# Patient Record
Sex: Male | Born: 1975 | Hispanic: Yes | Marital: Married | Smoking: Former smoker
Health system: Southern US, Community
[De-identification: ages and names within clinical notes are randomized; demographics above are authoritative.]

## PROBLEM LIST (undated history)

## (undated) DIAGNOSIS — Z87442 Personal history of urinary calculi: Secondary | ICD-10-CM

---

## 2014-12-19 ENCOUNTER — Emergency Department (HOSPITAL_COMMUNITY): Payer: Self-pay

## 2014-12-19 ENCOUNTER — Emergency Department (HOSPITAL_COMMUNITY)
Admission: EM | Admit: 2014-12-19 | Discharge: 2014-12-20 | Disposition: A | Discharge status: Home / Self Care | Payer: Self-pay | Attending: Emergency Medicine | Admitting: Emergency Medicine

## 2014-12-19 ENCOUNTER — Encounter (HOSPITAL_COMMUNITY): Payer: Self-pay | Admitting: Emergency Medicine

## 2014-12-19 DIAGNOSIS — Z720 Tobacco use: Secondary | ICD-10-CM | POA: Insufficient documentation

## 2014-12-19 DIAGNOSIS — R0602 Shortness of breath: Secondary | ICD-10-CM | POA: Insufficient documentation

## 2014-12-19 LAB — CBC
HCT: 44.1 % (ref 39.0–52.0)
HEMOGLOBIN: 15.1 g/dL (ref 13.0–17.0)
MCH: 29.8 pg (ref 26.0–34.0)
MCHC: 34.2 g/dL (ref 30.0–36.0)
MCV: 87.2 fL (ref 78.0–100.0)
Platelets: 237 10*3/uL (ref 150–400)
RBC: 5.06 MIL/uL (ref 4.22–5.81)
RDW: 12.3 % (ref 11.5–15.5)
WBC: 6.8 10*3/uL (ref 4.0–10.5)

## 2014-12-19 LAB — BASIC METABOLIC PANEL
ANION GAP: 9 (ref 5–15)
BUN: 14 mg/dL (ref 6–20)
CO2: 20 mmol/L — ABNORMAL LOW (ref 22–32)
Calcium: 8.6 mg/dL — ABNORMAL LOW (ref 8.9–10.3)
Chloride: 107 mmol/L (ref 101–111)
Creatinine, Ser: 0.93 mg/dL (ref 0.61–1.24)
GFR calc Af Amer: >60 mL/min (ref >60)
Glucose, Bld: 121 mg/dL — ABNORMAL HIGH (ref 65–99)
Potassium: 4 mmol/L (ref 3.5–5.1)
SODIUM: 136 mmol/L (ref 135–145)

## 2014-12-19 LAB — I-STAT TROPONIN, ED
TROPONIN I, POC: 0 ng/mL (ref 0.00–0.08)
Troponin i, poc: 0 ng/mL (ref 0.00–0.08)

## 2014-12-19 LAB — BRAIN NATRIURETIC PEPTIDE: B Natriuretic Peptide: 6 pg/mL (ref 0.0–100.0)

## 2014-12-19 NOTE — ED Provider Notes (Signed)
This chart was scribed for Layla Maw Ward, DO by Abel Presto, ED Scribe. This patient was seen in room D32C/D32C.  TIME SEEN: 11:55 PM   CHIEF COMPLAINT: Shortness of Breath  HPI: HPI Comments: Evan Nunez is a 39 y.o. male with no significant PMHX who presents to the Emergency Department complaining of worsening constant SOB with onset around 4 PM. Pt states he was sitting at onset. Pt notes associated sharp chest pain, numbness to general neck, and mild weakness in bilateral legs with ambulation. Pt reports recent stressors and notes he has been working over 50 hours a week. Pt is a smoker. Pt denies h/o PE/DVT, recent prolonged travel or surgeries, hospitalization, fracture, lower extremity swelling or pain and cardiac FHX. Pt denies cough and fever. Pain is not exertional or pleuritic. He is not having the chest pain currently.  ROS: See HPI Constitutional: no fever  Eyes: no drainage  ENT: no runny nose   Cardiovascular:  chest pain  Resp: SOB  GI: no vomiting GU: no dysuria Integumentary: no rash  Allergy: no hives  Musculoskeletal: no leg swelling  Neurological: no slurred speech ROS otherwise negative  PAST MEDICAL HISTORY/PAST SURGICAL HISTORY:  History reviewed. No pertinent past medical history.  MEDICATIONS:  Prior to Admission medications   Not on File    ALLERGIES:  Allergies not on file  SOCIAL HISTORY:  History  Substance Use Topics  . Smoking status: Current Every Day Smoker    Types: Cigarettes  . Smokeless tobacco: Not on file  . Alcohol Use: Yes    FAMILY HISTORY: No family history on file.  EXAM: BP 144/79 mmHg  Pulse 79  Temp(Src) 98 F (36.7 C) (Oral)  Resp 18  Wt 169 lb (76.658 kg)  SpO2 98% CONSTITUTIONAL: Alert and oriented and responds appropriately to questions. Well-appearing; well-nourished HEAD: Normocephalic EYES: Conjunctivae clear, PERRL ENT: normal nose; no rhinorrhea; moist mucous membranes; pharynx without lesions  noted NECK: Supple, no meningismus, no LAD  CARD: RRR; S1 and S2 appreciated; no murmurs, no clicks, no rubs, no gallops RESP: Normal chest excursion without splinting or tachypnea; breath sounds clear and equal bilaterally; no wheezes, no rhonchi, no rales, no hypoxia or respiratory distress, speaking full sentences ABD/GI: Normal bowel sounds; non-distended; soft, non-tender, no rebound, no guarding BACK:  The back appears normal and is non-tender to palpation, there is no CVA tenderness EXT: Normal ROM in all joints; non-tender to palpation; no edema; normal capillary refill; no cyanosis    SKIN: Normal color for age and race; warm NEURO: Moves all extremities equally PSYCH: The patient's mood and manner are appropriate. Grooming and personal hygiene are appropriate.  MEDICAL DECISION MAKING: Patient here with atypical chest pain. Chest wall nontender. He has no risk factors for pulmonary embolus or risk factors for ACS other than tobacco use. PERC negative. Lungs are clear with good aeration and no hypoxia. Chest x-ray clear. Troponin negative. EKG shows no ischemic changes. He states that he thinks some of his symptoms may be secondary to stress and has been feeling anxious and feels like he needs to "catch his breath". I do not feel he needs any further emergent workup today. Have given him outpatient follow-up information. Discussed return precautions. He verbalizes understanding and is comfortable with plan.     EKG Interpretation  Date/Time:  Sunday Dec 19 2014 22:55:18 EDT Ventricular Rate:  77 PR Interval:  156 QRS Duration: 94 QT Interval:  368 QTC Calculation: 416 R Axis:  115 Text Interpretation:  Normal sinus rhythm Left posterior fascicular block Abnormal ECG No old tracing to compare Confirmed by WARD,  DO, KRISTEN ([id number]) on 5/22/[phone] 11:49:34 PM        I personally performed the services described in this documentation, which was scribed in my presence. The recorded  information has been reviewed and is accurate.   Layla MawKristen N Ward, DO 12/20/14 [id number]

## 2014-12-19 NOTE — ED Notes (Signed)
C/o difficulty breathing, feeling shaky all over, and numbness to both sides of neck since this afternoon.  Denies pain.

## 2014-12-20 NOTE — Discharge Instructions (Signed)
Your labs, chest x-ray, vital signs and exam were reassuring today. I recommend he follow-up with a primary care physician if your symptoms continue.   Shortness of Breath Shortness of breath means you have trouble breathing. It could also mean that you have a medical problem. You should get immediate medical care for shortness of breath. CAUSES   Not enough oxygen in the air such as with high altitudes or a smoke-filled room.  Certain lung diseases, infections, or problems.  Heart disease or conditions, such as angina or heart failure.  Low red blood cells (anemia).  Poor physical fitness, which can cause shortness of breath when you exercise.  Chest or back injuries or stiffness.  Being overweight.  Smoking.  Anxiety, which can make you feel like you are not getting enough air. DIAGNOSIS  Serious medical problems can often be found during your physical exam. Tests may also be done to determine why you are having shortness of breath. Tests may include:  Chest X-rays.  Lung function tests.  Blood tests.  An electrocardiogram (ECG).  An ambulatory electrocardiogram. An ambulatory ECG records your heartbeat patterns over a 24-hour period.  Exercise testing.  A transthoracic echocardiogram (TTE). During echocardiography, sound waves are used to evaluate how blood flows through your heart.  A transesophageal echocardiogram (TEE).  Imaging scans. Your health care provider may not be able to find a cause for your shortness of breath after your exam. In this case, it is important to have a follow-up exam with your health care provider as directed.  TREATMENT  Treatment for shortness of breath depends on the cause of your symptoms and can vary greatly. HOME CARE INSTRUCTIONS   Do not smoke. Smoking is a common cause of shortness of breath. If you smoke, ask for help to quit.  Avoid being around chemicals or things that may bother your breathing, such as paint fumes and  dust.  Rest as needed. Slowly resume your usual activities.  If medicines were prescribed, take them as directed for the full length of time directed. This includes oxygen and any inhaled medicines.  Keep all follow-up appointments as directed by your health care provider. SEEK MEDICAL CARE IF:   Your condition does not improve in the time expected.  You have a hard time doing your normal activities even with rest.  You have any new symptoms. SEEK IMMEDIATE MEDICAL CARE IF:   Your shortness of breath gets worse.  You feel light-headed, faint, or develop a cough not controlled with medicines.  You start coughing up blood.  You have pain with breathing.  You have chest pain or pain in your arms, shoulders, or abdomen.  You have a fever.  You are unable to walk up stairs or exercise the way you normally do. MAKE SURE YOU:  Understand these instructions.  Will watch your condition.  Will get help right away if you are not doing well or get worse. Document Released: 04/10/2001 Document Revised: 07/21/2013 Document Reviewed: 10/01/2011 [facility] Patient Information 2015 [place], [place]. This information is not intended to replace advice given to you by your health care provider. Make sure you discuss any questions you have with your health care provider.

## 2015-12-20 ENCOUNTER — Encounter (HOSPITAL_COMMUNITY): Payer: Self-pay

## 2015-12-20 ENCOUNTER — Emergency Department (HOSPITAL_COMMUNITY)
Admission: EM | Admit: 2015-12-20 | Discharge: 2015-12-20 | Disposition: A | Discharge status: Home / Self Care | Payer: Self-pay | Attending: Emergency Medicine | Admitting: Emergency Medicine

## 2015-12-20 DIAGNOSIS — F17210 Nicotine dependence, cigarettes, uncomplicated: Secondary | ICD-10-CM | POA: Insufficient documentation

## 2015-12-20 DIAGNOSIS — L509 Urticaria, unspecified: Secondary | ICD-10-CM

## 2015-12-20 DIAGNOSIS — T7840XA Allergy, unspecified, initial encounter: Secondary | ICD-10-CM

## 2015-12-20 MED ORDER — HYDROXYZINE HCL 25 MG PO TABS: 25.0000 mg | ORAL_TABLET | Freq: Four times a day (QID) | ORAL | Status: DC | PRN

## 2015-12-20 MED ORDER — PREDNISONE 10 MG (21) PO TBPK: 10.0000 mg | ORAL_TABLET | Freq: Every day | ORAL | Status: DC

## 2015-12-20 MED ORDER — FAMOTIDINE 20 MG PO TABS: 20.0000 mg | ORAL_TABLET | Freq: Two times a day (BID) | ORAL | Status: DC

## 2015-12-20 MED ORDER — DIPHENHYDRAMINE HCL 25 MG PO TABS
ORAL_TABLET | ORAL | Status: DC
Start: 2015-12-20 — End: 2018-02-21

## 2015-12-20 MED ORDER — EPINEPHRINE 0.3 MG/0.3ML IJ SOAJ: 0.3000 mg | Freq: Once | INTRAMUSCULAR | Status: DC | PRN

## 2015-12-20 MED ORDER — DIPHENHYDRAMINE HCL 25 MG PO CAPS
25.0000 mg | ORAL_CAPSULE | Freq: Once | ORAL | Status: AC
  Administered 2015-12-20: 25 mg via ORAL
  Filled 2015-12-20: qty 1

## 2015-12-20 MED ORDER — FAMOTIDINE 20 MG PO TABS
20.0000 mg | ORAL_TABLET | Freq: Once | ORAL | Status: AC
  Administered 2015-12-20: 20 mg via ORAL
  Filled 2015-12-20: qty 1

## 2015-12-20 MED ORDER — PREDNISONE 20 MG PO TABS
60.0000 mg | ORAL_TABLET | Freq: Once | ORAL | Status: AC
  Administered 2015-12-20: 60 mg via ORAL
  Filled 2015-12-20: qty 3

## 2015-12-20 NOTE — ED Provider Notes (Signed)
CSN: [id number]     Arrival date & time 12/20/15  4098 History   First MD Initiated Contact with Patient 12/20/15 [phone]     Chief Complaint  Patient presents with  . Rash     (Consider location/radiation/quality/duration/timing/severity/associated sxs/prior Treatment) HPI   Evan Nunez is a 40 year old male, current smoker, otherwise healthy, who presents emergency department for evaluation of pruritic rash and hives located in his legs, groin area and side of abdomen. He states that he has had intermittent hives with worsening severity over the last 2 months. He occasionally takes Benadryl but has been following instructions on the box which says not take more than 3 in 1 day. Last night his hives were the severe that he was unable to sleep and he attempted to treat at home with only topical Benadryl without much relief.  He states that he has multiple episodes over the past 2 months of hives with associated facial swelling, lip swelling, numbness and tingling of his face and shortness of breath with wheeze.  He currently does not have any facial or intraoral edema or shortness of breath or wheeze. He denies any past history of seasonal, environmental or food allergies. No past medical history of asthma, eczema or atopy.  He has had the same detergent for several years, he has not tried any new foods. He does not take any prescription or over-the-counter medicine except for recently taking Benadryl.   He currently denies chest pain, shortness of breath, cough, wheeze, bowel pain, nausea, vomiting.   History reviewed. No pertinent past medical history. History reviewed. No pertinent past surgical history. No family history on file. Social History  Substance Use Topics  . Smoking status: Current Every Day Smoker    Types: Cigarettes  . Smokeless tobacco: None  . Alcohol Use: Yes    Review of Systems  All other systems reviewed and are negative.     Allergies  Review of patient's  allergies indicates no known allergies.  Home Medications   Prior to Admission medications   Medication Sig Start Date End Date Taking? Authorizing Provider  diphenhydrAMINE (BENADRYL) 25 MG tablet Take 25 to 50 mg every 4 to 8 hours as needed for allergic reaction or hives, do not exceed 300 mg/day 12/20/15   Danelle Berry, PA-C  EPINEPHrine (EPIPEN 2-PAK) 0.3 mg/0.3 mL IJ SOAJ injection Inject 0.3 mLs (0.3 mg total) into the muscle once as needed (for severe allergic reaction). CAll 911 immediately if you have to use this medicine 12/20/15   Danelle Berry, PA-C  famotidine (PEPCID) 20 MG tablet Take 1 tablet (20 mg total) by mouth 2 (two) times daily. 12/20/15   Danelle Berry, PA-C  hydrOXYzine (ATARAX/VISTARIL) 25 MG tablet Take 1 tablet (25 mg total) by mouth every 6 (six) hours as needed for itching. 12/20/15   Danelle Berry, PA-C  predniSONE (STERAPRED UNI-PAK 21 TAB) 10 MG (21) TBPK tablet Take 1 tablet (10 mg total) by mouth daily. Take 6 tabs by mouth daily  for 2 days, then 5 tabs for 2 days, then 4 tabs for 2 days, then 3 tabs for 2 days, 2 tabs for 2 days, then 1 tab by mouth daily for 2 days 12/20/15   Danelle Berry, PA-C   BP 142/83 mmHg  Pulse 80  Temp(Src) 98.3 F (36.8 C) (Oral)  Resp 18  Ht 5\' 2"  (1.575 m)  Wt 72.576 kg  BMI 29.26 kg/m2  SpO2 100% Physical Exam  Constitutional: He is oriented to person,  place, and time. He appears well-developed and well-nourished. No distress.  HENT:  Head: Normocephalic and atraumatic. Head is without right periorbital erythema and without left periorbital erythema.  Right Ear: External ear normal.  Left Ear: External ear normal.  Nose: Nose normal.  Mouth/Throat: Uvula is midline, oropharynx is clear and moist and mucous membranes are normal. Mucous membranes are not pale, not dry and not cyanotic. No trismus in the jaw. No uvula swelling. No oropharyngeal exudate, posterior oropharyngeal edema or posterior oropharyngeal erythema.  Eyes:  Conjunctivae and EOM are normal. Pupils are equal, round, and reactive to light. Right eye exhibits no discharge. Left eye exhibits no discharge. No scleral icterus.  Neck: Normal range of motion. No JVD present. No tracheal deviation present. No thyromegaly present.  Cardiovascular: Normal rate, regular rhythm, normal heart sounds and intact distal pulses.  Exam reveals no gallop and no friction rub.   No murmur heard. Pulmonary/Chest: Effort normal and breath sounds normal. No respiratory distress. He has no wheezes. He has no rales. He exhibits no tenderness.  Abdominal: Soft. Bowel sounds are normal. He exhibits no distension and no mass. There is no tenderness. There is no rebound and no guarding.  Musculoskeletal: Normal range of motion. He exhibits no edema or tenderness.  Lymphadenopathy:    He has no cervical adenopathy.  Neurological: He is alert and oriented to person, place, and time. He has normal reflexes. No cranial nerve deficit. He exhibits normal muscle tone. Coordination normal.  Skin: Skin is warm and dry. No rash noted. He is not diaphoretic. No erythema. No pallor.  Hives to bilateral inner thighs  Psychiatric: He has a normal mood and affect. His behavior is normal. Judgment and thought content normal.  Nursing note and vitals reviewed.   ED Course  Procedures (including critical care time) Labs Review Labs Reviewed - No data to display  Imaging Review No results found. I have personally reviewed and evaluated these images and lab results as part of my medical decision-making.   EKG Interpretation None      MDM   Rash consistent with hives. Patient denies any current difficulty breathing or swallowing.  Pt has a patent airway without stridor and is handling secretions without difficulty; no angioedema. No blisters, no pustules, no warmth, no draining sinus tracts, no superficial abscesses, no bullous impetigo, no vesicles, no desquamation, no target lesions with  dusky purpura or a central bulla. Not tender to touch. No concern for superimposed infection. No concern for SJS, TEN, TSS, tick borne illness, syphilis or other life-threatening condition. Will discharge home with steroid taper, pepcid, benadryl and also atarax for future use with hives.  Pt also given epi pen, reviewed instructions thoroughly.  Pt reported hives intermitently for 2 months and several episodes of facial and lip edema, unknown cause, pt understands to keep epi pen with him at all time and to use if experiencing worsening facial sx or SOB.  He currently has no such sx, and his hives have greatly improved since his presentation.  Case management to assist with follow up appointment.  Patient re-evaluated prior to dc, is hemodynamically stable, in no respiratory distress, and denies the feeling of throat closing. Pt has been advised to take OTC benadryl & return to the ED if they have a mod-severe allergic rxn (s/s including throat closing, difficulty breathing, swelling of lips face or tongue). Pt is to follow up with their PCP. Pt is agreeable with plan & verbalizes understanding.   Final  diagnoses:  Hives of unknown origin  Allergic reaction, initial encounter        Danelle BerryLeisa Allura Doepke, PA-C 12/21/15 1215  Arby BarretteMarcy Pfeiffer, MD 12/22/15 [id number]

## 2015-12-20 NOTE — Discharge Instructions (Signed)
Hives Hives are itchy, red, swollen areas of the skin. They can vary in size and location on your body. Hives can come and go for hours or several days (acute hives) or for several weeks (chronic hives). Hives do not spread from person to person (noncontagious). They may get worse with scratching, exercise, and emotional stress. CAUSES   Allergic reaction to food, additives, or drugs.  Infections, including the common cold.  Illness, such as vasculitis, lupus, or thyroid disease.  Exposure to sunlight, heat, or cold.  Exercise.  Stress.  Contact with chemicals. SYMPTOMS   Red or white swollen patches on the skin. The patches may change size, shape, and location quickly and repeatedly.  Itching.  Swelling of the hands, feet, and face. This may occur if hives develop deeper in the skin. DIAGNOSIS  Your caregiver can usually tell what is wrong by performing a physical exam. Skin or blood tests may also be done to determine the cause of your hives. In some cases, the cause cannot be determined. TREATMENT  Mild cases usually get better with medicines such as antihistamines. Severe cases may require an emergency epinephrine injection. If the cause of your hives is known, treatment includes avoiding that trigger.  HOME CARE INSTRUCTIONS   Avoid causes that trigger your hives.  Take antihistamines as directed by your caregiver to reduce the severity of your hives. Non-sedating or low-sedating antihistamines are usually recommended. Do not drive while taking an antihistamine.  Take any other medicines prescribed for itching as directed by your caregiver.  Wear loose-fitting clothing.  Keep all follow-up appointments as directed by your caregiver. SEEK MEDICAL CARE IF:   You have persistent or severe itching that is not relieved with medicine.  You have painful or swollen joints. SEEK IMMEDIATE MEDICAL CARE IF:   You have a fever.  Your tongue or lips are swollen.  You have  trouble breathing or swallowing.  You feel tightness in the throat or chest.  You have abdominal pain. These problems may be the first sign of a life-threatening allergic reaction. Call your local emergency services (911 in [place] MAKE SURE YOU:   Understand these instructions.  Will watch your condition.  Will get help right away if you are not doing well or get worse.   This information is not intended to replace advice given to you by your health care provider. Make sure you discuss any questions you have with your health care provider.   Document Released: 07/16/2005 Document Revised: 07/21/2013 Document Reviewed: 10/09/2011 Elsevier Interactive Patient Education 2016 Elsevier Inc[employer]  Epinephrine Injection Epinephrine is a medicine given by injection to temporarily treat an emergency allergic reaction. It is also used to treat severe asthmatic attacks and other lung problems. The medicine helps to enlarge (dilate) the small breathing tubes of the lungs. A life-threatening, sudden allergic reaction that involves the whole body is called anaphylaxis. Because of potential side effects, epinephrine should only be used as directed by your caregiver. RISKS AND COMPLICATIONS Possible side effects of epinephrine injections include:  Chest pain.  Irregular or rapid heartbeat.  Shortness of breath.  Nausea.  Vomiting.  Abdominal pain or cramping.  Sweating.  Dizziness.  Weakness.  Headache.  Nervousness. Report all side effects to your caregiver. HOW TO GIVE AN EPINEPHRINE INJECTION Give the epinephrine injection immediately when symptoms of a severe reaction begin. Inject the medicine into the outer thigh or any available, large muscle. Your caregiver can teach you how to do this. You do  not need to remove any clothing. After the injection, call your local emergency services (911 in [place] Even if you improve after the injection, you need to be examined at a hospital  emergency department. Epinephrine works quickly, but it also wears off quickly. Delayed reactions can occur. A delayed reaction may be as serious and dangerous as the initial reaction. HOME CARE INSTRUCTIONS  Make sure you and your family know how to give an epinephrine injection.  Use epinephrine injections as directed by your caregiver. Do not use this medicine more often or in larger doses than prescribed.  Always carry your epinephrine injection or anaphylaxis kit with you. This can be lifesaving if you have a severe reaction.  Store the medicine in a cool, dry place. If the medicine becomes discolored or cloudy, dispose of it properly and replace it with new medicine.  Check the expiration date on your medicine. It may be unsafe to use medicines past their expiration date.  Tell your caregiver about any other medicines you are taking. Some medicines can react badly with epinephrine.  Tell your caregiver about any medical conditions you have, such as diabetes, high blood pressure (hypertension), heart disease, irregular heartbeats, or if you are pregnant. SEEK IMMEDIATE MEDICAL CARE IF:  You have used an epinephrine injection. Call your local emergency services (911 in [place] Even if you improve after the injection, you need to be examined at a hospital emergency department to make sure your allergic reaction is under control. You will also be monitored for adverse effects from the medicine.  You have chest pain.  You have irregular or fast heartbeats.  You have shortness of breath.  You have severe headaches.  You have severe nausea, vomiting, or abdominal cramps.  You have severe pain, swelling, or redness in the area where you gave the injection.   This information is not intended to replace advice given to you by your health care provider. Make sure you discuss any questions you have with your health care provider.   Document Released: 07/13/2000 Document Revised: 10/08/2011  Document Reviewed: 02/02/2015 Elsevier Interactive Patient Education 2016 Elsevier Inc.  Angioedema Angioedema is a sudden swelling of tissues, often of the skin. It can occur on the face or genitals or in the abdomen or other body parts. The swelling usually develops over a short period and gets better in 24 to 48 hours. It often begins during the night and is found when the person wakes up. The person may also get red, itchy patches of skin (hives). Angioedema can be dangerous if it involves swelling of the air passages.  Depending on the cause, episodes of angioedema may only happen once, come back in unpredictable patterns, or repeat for several years and then gradually fade away.  CAUSES  Angioedema can be caused by an allergic reaction to various triggers. It can also result from nonallergic causes, including reactions to drugs, immune system disorders, viral infections, or an abnormal gene that is passed to you from your parents (hereditary). For some people with angioedema, the cause is unknown.  Some things that can trigger angioedema include:   Foods.   Medicines, such as ACE inhibitors, ARBs, nonsteroidal anti-inflammatory agents, or estrogen.   Latex.   Animal saliva.   Insect stings.   Dyes used in X-rays.   Mild injury.   Dental work.  Surgery.  Stress.   Sudden changes in temperature.   Exercise. SIGNS AND SYMPTOMS   Swelling of the skin.  Hives. If  these are present, there is also intense itching.  Redness in the affected area.   Pain in the affected area.  Swollen lips or tongue.  Breathing problems. This may happen if the air passages swell.  Wheezing. If internal organs are involved, there may be:   Nausea.   Abdominal pain.   Vomiting.   Difficulty swallowing.   Difficulty passing urine. DIAGNOSIS   Your health care provider will examine the affected area and take a medical and family history.  Various tests may be done  to help determine the cause. Tests may include:  Allergy skin tests to see if the problem is an allergic reaction.   Blood tests to check for hereditary angioedema.   Tests to check for underlying diseases that could cause the condition.   A review of your medicines, including over-the-counter medicines, may be done. TREATMENT  Treatment will depend on the cause of the angioedema. Possible treatments include:   Removal of anything that triggered the condition (such as stopping certain medicines).   Medicines to treat symptoms or prevent attacks. Medicines given may include:   Antihistamines.   Epinephrine injection.   Steroids.   Hospitalization may be required for severe attacks. If the air passages are affected, it can be an emergency. Tubes may need to be placed to keep the airway open. HOME CARE INSTRUCTIONS   Take all medicines as directed by your health care provider.  If you were given medicines for emergency allergy treatment, always carry them with you.  Wear a medical bracelet as directed by your health care provider.   Avoid known triggers. SEEK MEDICAL CARE IF:   You have repeat attacks of angioedema.   Your attacks are more frequent or more severe despite preventive measures.   You have hereditary angioedema and are considering having children. It is important to discuss with your health care provider the risks of passing the condition on to your children. SEEK IMMEDIATE MEDICAL CARE IF:   You have severe swelling of the mouth, tongue, or lips.  You have difficulty breathing.   You have difficulty swallowing.   You faint. MAKE SURE YOU:  Understand these instructions.  Will watch your condition.  Will get help right away if you are not doing well or get worse.   This information is not intended to replace advice given to you by your health care provider. Make sure you discuss any questions you have with your health care provider.     Document Released: 09/24/2001 Document Revised: 08/06/2014 Document Reviewed: 03/09/2013 Elsevier Interactive Patient Education 2016 [place]. Anaphylactic Reaction An anaphylactic reaction is a sudden, severe allergic reaction that involves the whole body. It can be life threatening. A hospital stay is often required. People with asthma, eczema, or hay fever are slightly more likely to have an anaphylactic reaction. CAUSES  An anaphylactic reaction may be caused by anything to which you are allergic. After being exposed to the allergic substance, your immune system becomes sensitized to it. When you are exposed to that allergic substance again, an allergic reaction can occur. Common causes of an anaphylactic reaction include:  Medicines.  Foods, especially peanuts, wheat, shellfish, milk, and eggs.  Insect bites or stings.  Blood products.  Chemicals, such as dyes, latex, and contrast material used for imaging tests. SYMPTOMS  When an allergic reaction occurs, the body releases histamine and other substances. These substances cause symptoms such as tightening of the airway. Symptoms often develop within seconds or minutes of  exposure. Symptoms may include:  Skin rash or hives.  Itching.  Chest tightness.  Swelling of the eyes, tongue, or lips.  Trouble breathing or swallowing.  Lightheadedness or fainting.  Anxiety or confusion.  Stomach pains, vomiting, or diarrhea.  Nasal congestion.  A fast or irregular heartbeat (palpitations). DIAGNOSIS  Diagnosis is based on your history of recent exposure to allergic substances, your symptoms, and a physical exam. Your caregiver may also perform blood or urine tests to confirm the diagnosis. TREATMENT  Epinephrine medicine is the main treatment for an anaphylactic reaction. Other medicines that may be used for treatment include antihistamines, steroids, and albuterol. In severe cases, fluids and medicine to support blood  pressure may be given through an intravenous line (IV). Even if you improve after treatment, you need to be observed to make sure your condition does not get worse. This may require a stay in the hospital. [place] a medical alert bracelet or necklace stating your allergy.  You and your family must learn how to use an anaphylaxis kit or give an epinephrine injection to temporarily treat an emergency allergic reaction. Always carry your epinephrine injection or anaphylaxis kit with you. This can be lifesaving if you have a severe reaction.  Do not drive or perform tasks after treatment until the medicines used to treat your reaction have worn off, or until your caregiver says it is okay.  If you have hives or a rash:  Take medicines as directed by your caregiver.  You may use an over-the-counter antihistamine (diphenhydramine) as needed.  Apply cold compresses to the skin or take baths in cool water. Avoid hot baths or showers. SEEK MEDICAL CARE IF:   You develop symptoms of an allergic reaction to a new substance. Symptoms may start right away or minutes later.  You develop a rash, hives, or itching.  You develop new symptoms. SEEK IMMEDIATE MEDICAL CARE IF:   You have swelling of the mouth, difficulty breathing, or wheezing.  You have a tight feeling in your chest or throat.  You develop hives, swelling, or itching all over your body.  You develop severe vomiting or diarrhea.  You feel faint or pass out. This is an emergency. Use your epinephrine injection or anaphylaxis kit as you have been instructed. Call your local emergency services (911 in [place] Even if you improve after the injection, you need to be examined at a hospital emergency department. MAKE SURE YOU:   Understand these instructions.  Will watch your condition.  Will get help right away if you are not doing well or get worse.   This information is not intended to replace advice given to  you by your health care provider. Make sure you discuss any questions you have with your health care provider.   Document Released: 07/16/2005 Document Revised: 07/21/2013 Document Reviewed: 01/26/2015 Elsevier Interactive Patient Education 2016 [employer].  Allergies An allergy is an abnormal reaction to a substance by the body's defense system (immune system). Allergies can develop at any age. WHAT CAUSES ALLERGIES? An allergic reaction happens when the immune system mistakenly reacts to a normally harmless substance, called an allergen, as if it were harmful. The immune system releases antibodies to fight the substance. Antibodies eventually release a chemical called histamine into the bloodstream. The release of histamine is meant to protect the body from infection, but it also causes discomfort. An allergic reaction can be triggered by:  Eating an allergen.  Inhaling an allergen.  Touching an allergen. WHAT TYPES OF ALLERGIES ARE THERE? There are many types of allergies. Common types include:  Seasonal allergies. People with this type of allergy are usually allergic to substances that are only present during certain seasons, such as molds and pollens.  Food allergies.  Drug allergies.  Insect allergies.  Animal dander allergies. WHAT ARE SYMPTOMS OF ALLERGIES? Possible allergy symptoms include:  Swelling of the lips, face, tongue, mouth, or throat.  Sneezing, coughing, or wheezing.  Nasal congestion.  Tingling in the mouth.  Rash.  Itching.  Itchy, red, swollen areas of skin (hives).  Watery eyes.  Vomiting.  Diarrhea.  Dizziness.  Lightheadedness.  Fainting.  Trouble breathing or swallowing.  Chest tightness.  Rapid heartbeat. HOW ARE ALLERGIES DIAGNOSED? Allergies are diagnosed with a medical and family history and one or more of the following:  Skin tests.  Blood tests.  A food diary. A food diary is a record of all the foods and drinks  you have in a day and of all the symptoms you experience.  The results of an elimination diet. An elimination diet involves eliminating foods from your diet and then adding them back in one by one to find out if a certain food causes an allergic reaction. HOW ARE ALLERGIES TREATED? There is no cure for allergies, but allergic reactions can be treated with medicine. Severe reactions usually need to be treated at a hospital. HOW CAN REACTIONS BE PREVENTED? The best way to prevent an allergic reaction is by avoiding the substance you are allergic to. Allergy shots and medicines can also help prevent reactions in some cases. People with severe allergic reactions may be able to prevent a life-threatening reaction called anaphylaxis with a medicine given right after exposure to the allergen.   This information is not intended to replace advice given to you by your health care provider. Make sure you discuss any questions you have with your health care provider.   Document Released: 10/09/2002 Document Revised: 08/06/2014 Document Reviewed: 04/27/2014 Elsevier Interactive Patient Education [employer].

## 2015-12-20 NOTE — ED Notes (Signed)
Patient here with intermittent itchy rash to legs and sides. Mild hives with same. Takes benadryl occasionally with relief. Area red and trying benadryl cream

## 2015-12-20 NOTE — Progress Notes (Signed)
Spoke to patient regarding primary care resources and [facility]. Follow up appointment made with [facility] for Monday June 12,2017 @11 :00am, pt verbalized understanding of the upcoming appointment. Orange card application provided and explained, pt instructed to contact me once application is complete to obtain the orange card. NCM consulted regarding patient medications. My contact information provided for any future questions or concerns. No other [facility] needs identified at this time.    Buddy DutyFelicia Evans [id number] [phone]

## 2016-01-09 ENCOUNTER — Ambulatory Visit: Payer: Self-pay | Admitting: Family Medicine

## 2017-03-26 ENCOUNTER — Encounter (INDEPENDENT_AMBULATORY_CARE_PROVIDER_SITE_OTHER): Payer: Self-pay | Admitting: Orthopaedic Surgery

## 2017-03-26 ENCOUNTER — Ambulatory Visit (INDEPENDENT_AMBULATORY_CARE_PROVIDER_SITE_OTHER): Payer: Self-pay | Admitting: Orthopaedic Surgery

## 2017-03-26 VITALS — BP 141/73 | HR 76 | Resp 14 | Ht 67.0 in | Wt 165.0 lb

## 2017-03-26 DIAGNOSIS — M65342 Trigger finger, left ring finger: Secondary | ICD-10-CM

## 2017-03-26 NOTE — Progress Notes (Signed)
Office Visit Note   Patient: Evan Nunez           Date of Birth: [birth year]           MRN: [id number] Visit Date: 03/26/2017              Requested by: No referring provider defined for this encounter. PCP: Patient, No Pcp Per   Assessment & Plan: Visit Diagnoses:  1. Trigger finger, left ring finger     Plan: Long discussion regarding pathology. Has had the problem progressively over several years. I discussed surgery and he would like to proceed with A1 pulley release. We'll try and schedule as soon as possible .we'll give him a note saying that he can't lift over 5 pounds with his left upper extremity  Follow-Up Instructions: Return will schedule surgery.   Orders:  No orders of the defined types were placed in this encounter.  No orders of the defined types were placed in this encounter.     Procedures: No procedures performed   Clinical Data: No additional findings.   Subjective: Chief Complaint  Patient presents with  . Left Hand - Pain, Edema    Evan Nunez is a 41 y o that presents with Left Long finger and Left ring finger gettting stuck. Pt bought a splint at [employer], numbness and tingling in those fingers  Has had episodic pain in that same left ring finger over several years. Is reached a point was having considerable pain performing activities of daily living and his vocational activities. 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 He's purchased a small splint from [employer] but that this interferes with his activities. "Quite uncomfortable". Denies any numbness or tingling, injury or trauma.  HPI  Review of Systems  Constitutional: Negative for fatigue.  HENT: Negative for hearing loss.   Respiratory: Positive for shortness of breath. Negative for apnea and chest tightness.   Cardiovascular: Negative for chest pain, palpitations and leg swelling.  Gastrointestinal: Negative for blood in stool, constipation and diarrhea.  Genitourinary:  Negative for difficulty urinating.  Musculoskeletal: Negative for arthralgias, back pain, joint swelling, myalgias, neck pain and neck stiffness.  Neurological: Negative for weakness, numbness and headaches.  Hematological: Does not bruise/bleed easily.  Psychiatric/Behavioral: Negative for sleep disturbance. The patient is not nervous/anxious.      Objective: Vital Signs: BP (!) 141/73   Pulse 76   Resp 14   Ht 5\' 7"  (1.702 m)   Wt 165 lb (74.8 kg)   BMI 25.84 kg/m   Physical Exam  Ortho Exam left hand with tenderness directly over the palm associated with a painful nodule at the metacarpal phalangeal joint junction ring finger active triggering that he is trying to avoid it related to pain. Neurovascular exam intact. No swelling of the ring finger. No pain in the long or little finger. No pain with range of motion of the PIP joint  Specialty Comments:  No specialty comments available.  Imaging: No results found.   PMFS History: There are no active problems to display for this patient.  History reviewed. No pertinent past medical history.  History reviewed. No pertinent family history.  History reviewed. No pertinent surgical history. Social History   Occupational History  . Not on file.   Social History Main Topics  . Smoking status: Current Some Day Smoker    Types: Cigarettes  . Smokeless tobacco: Never Used  . Alcohol use Yes  . Drug use: No  .  Sexual activity: Not on file

## 2017-03-28 DIAGNOSIS — M65342 Trigger finger, left ring finger: Secondary | ICD-10-CM

## 2017-03-29 ENCOUNTER — Telehealth (INDEPENDENT_AMBULATORY_CARE_PROVIDER_SITE_OTHER): Payer: Self-pay | Admitting: Orthopaedic Surgery

## 2017-03-29 NOTE — Telephone Encounter (Signed)
Please advise 

## 2017-03-29 NOTE — Telephone Encounter (Signed)
called

## 2017-03-29 NOTE — Telephone Encounter (Signed)
FYI: Patient called to verify his follow up post op appt. Patient mentioned having a lot of pain last pm with hand, and having to take 2 pain pills to help. Per patient pain this am is better (less).

## 2017-04-02 ENCOUNTER — Ambulatory Visit (INDEPENDENT_AMBULATORY_CARE_PROVIDER_SITE_OTHER): Payer: Self-pay | Admitting: Orthopaedic Surgery

## 2017-04-02 ENCOUNTER — Inpatient Hospital Stay (INDEPENDENT_AMBULATORY_CARE_PROVIDER_SITE_OTHER): Payer: Self-pay | Admitting: Orthopaedic Surgery

## 2017-04-02 ENCOUNTER — Encounter (INDEPENDENT_AMBULATORY_CARE_PROVIDER_SITE_OTHER): Payer: Self-pay | Admitting: Orthopaedic Surgery

## 2017-04-02 VITALS — BP 120/70 | HR 70 | Ht 68.0 in | Wt 165.0 lb

## 2017-04-02 DIAGNOSIS — M79642 Pain in left hand: Secondary | ICD-10-CM

## 2017-04-02 NOTE — Progress Notes (Signed)
   Post-Op Visit Note   Patient: Evan DallasMauro Freiermuth           Date of Birth: [birth year]           MRN: [id number] Visit Date: 04/02/2017 PCP: Patient, No Pcp Per   Assessment & Plan:  Chief Complaint:  Chief Complaint  Patient presents with  . Left Hand - Routine Post Op   Visit Diagnoses:  1. Pain of left hand   5 days status post left ring trigger finger release. Denies fever or chills. Dressing removed. Incision clean and dry. Waterproof Band-Aid applied and begin range of motion exercises. Neurovascular intact. Given a note for light duty at work. Return in one week for stitch removal  Follow-Up Instructions:    Orders:  No orders of the defined types were placed in this encounter.  No orders of the defined types were placed in this encounter.   Imaging: No results found.  PMFS History: There are no active problems to display for this patient.  No past medical history on file.  No family history on file.  No past surgical history on file. Social History   Occupational History  . Not on file.   Social History Main Topics  . Smoking status: Current Some Day Smoker    Types: Cigarettes  . Smokeless tobacco: Never Used  . Alcohol use Yes  . Drug use: No  . Sexual activity: Not on file

## 2017-04-11 ENCOUNTER — Encounter (INDEPENDENT_AMBULATORY_CARE_PROVIDER_SITE_OTHER): Payer: Self-pay | Admitting: Orthopedic Surgery

## 2017-04-11 ENCOUNTER — Ambulatory Visit (INDEPENDENT_AMBULATORY_CARE_PROVIDER_SITE_OTHER): Payer: Self-pay | Admitting: Orthopedic Surgery

## 2017-04-11 DIAGNOSIS — M65342 Trigger finger, left ring finger: Secondary | ICD-10-CM

## 2017-04-11 DIAGNOSIS — M65332 Trigger finger, left middle finger: Secondary | ICD-10-CM

## 2017-04-11 NOTE — Progress Notes (Signed)
   Office Visit Note   Patient: Evan Nunez           Date of Birth: 1976-03-29           MRN: [id number] Visit Date: 04/11/2017              Requested by: No referring provider defined for this encounter. PCP: Patient, No Pcp Per   Assessment & Plan: Visit Diagnoses:  1. Trigger finger, left ring finger   2. Trigger finger, left middle finger     Plan:  #1: Sutures removed and Steri-Strips are placed. Wounds healed per primam with no signs of infection. #2: Placed an AlumaFoam splint in full extension for the left middle finger  Follow-Up Instructions: Return in about 1 week (around 04/18/2017).   Orders:  No orders of the defined types were placed in this encounter.  No orders of the defined types were placed in this encounter.     Procedures: No procedures performed   Clinical Data: No additional findings.   Subjective: No chief complaint on file.   HPI  Evan Nunez returns today for follow-up of his trigger finger release. He is now 2 weeks post injury post surgery. He complains that at times it feels as if his finger of this catching and cannot straighten it completely. He is able to flex at the end of his palm he states. Denies any neurovascular compromise.  Review of Systems   Objective: Vital Signs: There were no vitals taken for this visit.  Physical Exam  Ortho Exam  Exam today reveals wound (signs of infection. I do not feel cold locking of it though he does have difficulty getting it fully extended. I was able to do that the very easily in the office. Serum does have tenderness over the operative site.  Specialty Comments:  No specialty comments available.  Imaging: No results found.   PMFS History: There are no active problems to display for this patient.  No past medical history on file.  No family history on file.  No past surgical history on file. Social History   Occupational History  . Not on file.   Social History Main  Topics  . Smoking status: Current Some Day Smoker    Types: Cigarettes  . Smokeless tobacco: Never Used  . Alcohol use Yes  . Drug use: No  . Sexual activity: Not on file

## 2017-04-22 ENCOUNTER — Ambulatory Visit (INDEPENDENT_AMBULATORY_CARE_PROVIDER_SITE_OTHER): Payer: Self-pay | Admitting: Orthopaedic Surgery

## 2018-02-21 ENCOUNTER — Ambulatory Visit (HOSPITAL_COMMUNITY)
Admission: EM | Admit: 2018-02-21 | Discharge: 2018-02-21 | Disposition: A | Discharge status: Home / Self Care | Payer: Self-pay | Attending: Family Medicine | Admitting: Family Medicine

## 2018-02-21 ENCOUNTER — Encounter (HOSPITAL_COMMUNITY): Payer: Self-pay | Admitting: Emergency Medicine

## 2018-02-21 ENCOUNTER — Other Ambulatory Visit: Payer: Self-pay

## 2018-02-21 DIAGNOSIS — R109 Unspecified abdominal pain: Secondary | ICD-10-CM

## 2018-02-21 DIAGNOSIS — R112 Nausea with vomiting, unspecified: Secondary | ICD-10-CM

## 2018-02-21 DIAGNOSIS — R197 Diarrhea, unspecified: Secondary | ICD-10-CM

## 2018-02-21 MED ORDER — ONDANSETRON HCL 4 MG PO TABS: 4.0000 mg | ORAL_TABLET | Freq: Three times a day (TID) | ORAL | 0 refills | Status: DC | PRN

## 2018-02-21 MED ORDER — ONDANSETRON 4 MG PO TBDP
ORAL_TABLET | ORAL | Status: AC
  Filled 2018-02-21: qty 1

## 2018-02-21 MED ORDER — KETOROLAC TROMETHAMINE 30 MG/ML IJ SOLN
INTRAMUSCULAR | Status: AC
  Filled 2018-02-21: qty 1

## 2018-02-21 MED ORDER — KETOROLAC TROMETHAMINE 30 MG/ML IJ SOLN
30.0000 mg | Freq: Once | INTRAMUSCULAR | Status: AC
  Administered 2018-02-21: 30 mg via INTRAMUSCULAR

## 2018-02-21 MED ORDER — ONDANSETRON 4 MG PO TBDP
4.0000 mg | ORAL_TABLET | Freq: Once | ORAL | Status: AC
Start: 2018-02-21 — End: 2018-02-21
  Administered 2018-02-21: 4 mg via ORAL

## 2018-02-21 MED ORDER — ONDANSETRON 8 MG PO TBDP: 8.0000 mg | ORAL_TABLET | Freq: Three times a day (TID) | ORAL | 0 refills | Status: DC | PRN

## 2018-02-21 NOTE — ED Provider Notes (Signed)
[facility]    CSN: [id number] Arrival date & time: 02/21/18  0845     History   Chief Complaint Chief Complaint  Patient presents with  . Abdominal Cramping    HPI Evan Nunez is a 42 y.o. male.   Patient is a healthy 42 year old male that presents with nausea, vomiting, abdominal cramping, diarrhea that started at 3 AM.  The symptoms have been constant with severe abdominal cramping.  He has had 5 episodes of watery/jelly diarrhea and 5 episodes of vomiting.  He denies any blood in his stool or vomit.  He states that he ate a meal at home last night that consisted of broccoli, pork chop, potatoes, corn and drink 2 beers.  He has a few drinks a week but is not a heavy drinker.  Nobody else in the home has been sick.  He has had chills, body aches associated with it.  He reports that the worst part is the severe abdominal cramping.  He denies any recent traveling, insect bites, rashes.  ROS per HPI      History reviewed. No pertinent past medical history.  There are no active problems to display for this patient.   History reviewed. No pertinent surgical history.     Home Medications    Prior to Admission medications   Medication Sig Start Date End Date Taking? Authorizing Provider  ondansetron (ZOFRAN ODT) 8 MG disintegrating tablet Take 1 tablet (8 mg total) by mouth every 8 (eight) hours as needed for nausea or vomiting. 02/21/18   Dahlia ByesBast, Elizabet Schweppe A, NP  ondansetron (ZOFRAN) 4 MG tablet Take 1 tablet (4 mg total) by mouth every 8 (eight) hours as needed for nausea or vomiting. 02/21/18   Janace ArisBast, Audriella Blakeley A, NP    Family History History reviewed. No pertinent family history.  Social History Social History   Tobacco Use  . Smoking status: Current Some Day Smoker    Types: Cigarettes  . Smokeless tobacco: Never Used  Substance Use Topics  . Alcohol use: Yes  . Drug use: No     Allergies   Patient has no known allergies.   Review of Systems Review of  Systems   Physical Exam Triage Vital Signs ED Triage Vitals  Enc Vitals Group     BP 02/21/18 0856 127/72     Pulse Rate 02/21/18 0856 75     Resp 02/21/18 0856 16     Temp 02/21/18 0856 98.4 F (36.9 C)     Temp Source 02/21/18 0856 Oral     SpO2 02/21/18 0856 100 %     Weight --      Height --      Head Circumference --      Peak Flow --      Pain Score 02/21/18 0858 9     Pain Loc --      Pain Edu? --      Excl. in GC? --    No data found.  Updated Vital Signs BP 127/72 (BP Location: Left Arm)   Pulse 75   Temp 98.4 F (36.9 C) (Oral)   Resp 16   SpO2 100%   Visual Acuity Right Eye Distance:   Left Eye Distance:   Bilateral Distance:    Right Eye Near:   Left Eye Near:    Bilateral Near:     Physical Exam  Constitutional: He is oriented to person, place, and time. He appears well-developed and well-nourished. He appears distressed.  Cardiovascular:  Normal rate and regular rhythm.  Pulmonary/Chest: Effort normal and breath sounds normal.  Abdominal: Soft. Bowel sounds are normal. He exhibits no distension and no mass. There is tenderness. There is no rebound and no guarding. No hernia.  Tender to palpation over umbilicus.  Negative rebound.  No hepatomegaly or splenomegaly.  No masses.   Neurological: He is alert and oriented to person, place, and time.  Skin: Skin is warm and dry.  Psychiatric: He has a normal mood and affect.  Nursing note and vitals reviewed.    UC Treatments / Results  Labs (all labs ordered are listed, but only abnormal results are displayed) Labs Reviewed - No data to display  EKG None  Radiology No results found.  Procedures Procedures (including critical care time)  Medications Ordered in UC Medications  ketorolac (TORADOL) 30 MG/ML injection 30 mg (30 mg Intramuscular Given 02/21/18 1002)  ondansetron (ZOFRAN-ODT) disintegrating tablet 4 mg (4 mg Oral Given 02/21/18 1001)    Initial Impression / Assessment and Plan /  UC Course  I have reviewed the triage vital signs and the nursing notes.  Pertinent labs & imaging results that were available during my care of the patient were reviewed by me and considered in my medical decision making (see chart for details).     We will give IM Toradol shot for pain and cramping along with ODT Zofran for nausea vomiting and then reassess.  Patient feeling better after medication.  Will give prescription for Zofran for nausea vomiting.  Stay hydrated and advance diet as tolerated.  Return precautions given Final Clinical Impressions(s) / UC Diagnoses   Final diagnoses:  Abdominal cramping     Discharge Instructions     It was nice meeting you!!  We gave you an injection for pain and nausea here. Glad you are feeling better.  I believe that you have a stomach virus. It could be 24 to 48 hours before you start to feel better but the symptoms will improve over time.  Advance diet as tolerated but I would start with Gatorade, ginger ale or water and then advance to bland foods.  I will give you some Zofran for nausea vomiting.  Follow up as needed if symptoms persist or worsen.      ED Prescriptions    Medication Sig Dispense Auth. Provider   ondansetron (ZOFRAN ODT) 8 MG disintegrating tablet Take 1 tablet (8 mg total) by mouth every 8 (eight) hours as needed for nausea or vomiting. 20 tablet Astha Probasco A, NP   ondansetron (ZOFRAN) 4 MG tablet Take 1 tablet (4 mg total) by mouth every 8 (eight) hours as needed for nausea or vomiting. 10 tablet Dahlia Byes A, NP     Controlled Substance Prescriptions [place] Controlled Substance Registry consulted? Not Applicable   Janace Aris, NP 02/21/18 1143

## 2018-02-21 NOTE — Discharge Instructions (Addendum)
It was nice meeting you!!  We gave you an injection for pain and nausea here. Glad you are feeling better.  I believe that you have a stomach virus. It could be 24 to 48 hours before you start to feel better but the symptoms will improve over time.  Advance diet as tolerated but I would start with Gatorade, ginger ale or water and then advance to bland foods.  I will give you some Zofran for nausea vomiting.  Follow up as needed if symptoms persist or worsen.

## 2018-02-21 NOTE — ED Triage Notes (Signed)
[facility] with a complaint of abdominal cramps with N/V/D that started around 3 am this date.

## 2020-01-04 ENCOUNTER — Other Ambulatory Visit: Payer: Self-pay

## 2020-01-04 ENCOUNTER — Ambulatory Visit (INDEPENDENT_AMBULATORY_CARE_PROVIDER_SITE_OTHER): Payer: Self-pay

## 2020-01-04 ENCOUNTER — Encounter (HOSPITAL_COMMUNITY): Payer: Self-pay

## 2020-01-04 ENCOUNTER — Ambulatory Visit (HOSPITAL_COMMUNITY)
Admission: EM | Admit: 2020-01-04 | Discharge: 2020-01-04 | Disposition: A | Discharge status: Home / Self Care | Payer: Self-pay | Attending: Emergency Medicine | Admitting: Emergency Medicine

## 2020-01-04 DIAGNOSIS — M79661 Pain in right lower leg: Secondary | ICD-10-CM

## 2020-01-04 DIAGNOSIS — S86111A Strain of other muscle(s) and tendon(s) of posterior muscle group at lower leg level, right leg, initial encounter: Secondary | ICD-10-CM

## 2020-01-04 MED ORDER — NAPROXEN 500 MG PO TABS: 500.0000 mg | ORAL_TABLET | Freq: Two times a day (BID) | ORAL | 0 refills | Status: DC

## 2020-01-04 NOTE — Discharge Instructions (Signed)
Please contact sports medicine to follow-up with-contact below Naprosyn twice daily with food May try gentle stretching using attached exercises Ice and elevate throughout the day as you are able Weight-bear as tolerated, may use crutches to help with walking  Please follow-up if any symptoms not improving or worsening, developing increased leg swelling redness or warmth to touch

## 2020-01-04 NOTE — ED Triage Notes (Signed)
Pt c/o right leg pain/swelling/bruising x 1 week after injury playing tennis

## 2020-01-04 NOTE — ED Provider Notes (Signed)
[facility]    CSN: [id number] Arrival date & time: 01/04/20  0802      History   Chief Complaint Chief Complaint  Patient presents with  . Leg Pain    HPI Evan Nunez is a 44 y.o. male no significant past medical history presenting today for evaluation right calf pain/injury.  Patient reports that he was playing tennis approximately 1 week ago reports that he jumped landed and felt a pulling sensation in the back of his leg.  Since he has had continued pain swelling and has developed some associated bruising in his foot.  He denies any rolling or twisting of his ankle/foot.  Majority of pain is located in his calf.  Denies knee pain.  Denies prior DVT/PE.  Denies tobacco use.  Denies recent travel/immobilization.  HPI  History reviewed. No pertinent past medical history.  There are no problems to display for this patient.   History reviewed. No pertinent surgical history.     Home Medications    Prior to Admission medications   Medication Sig Start Date End Date Taking? Authorizing Provider  naproxen (NAPROSYN) 500 MG tablet Take 1 tablet (500 mg total) by mouth 2 (two) times daily. 01/04/20   Baptiste Littler, Junius Creamer, PA-C    Family History Family History  Problem Relation Age of Onset  . Healthy Mother     Social History Social History   Tobacco Use  . Smoking status: Current Some Day Smoker    Types: Cigarettes  . Smokeless tobacco: Never Used  Substance Use Topics  . Alcohol use: Yes  . Drug use: No     Allergies   Patient has no known allergies.   Review of Systems Review of Systems  Constitutional: Negative for fatigue and fever.  Eyes: Negative for redness, itching and visual disturbance.  Respiratory: Negative for shortness of breath.   Cardiovascular: Negative for chest pain and leg swelling.  Gastrointestinal: Negative for nausea and vomiting.  Musculoskeletal: Positive for myalgias. Negative for arthralgias.  Skin: Negative for color  change, rash and wound.  Neurological: Negative for dizziness, syncope, weakness, light-headedness and headaches.     Physical Exam Triage Vital Signs ED Triage Vitals  Enc Vitals Group     BP      Pulse      Resp      Temp      Temp src      SpO2      Weight      Height      Head Circumference      Peak Flow      Pain Score      Pain Loc      Pain Edu?      Excl. in GC?    No data found.  Updated Vital Signs BP (!) 146/93   Pulse 83   Temp 98 F (36.7 C)   Resp 18   SpO2 96%   Visual Acuity Right Eye Distance:   Left Eye Distance:   Bilateral Distance:    Right Eye Near:   Left Eye Near:    Bilateral Near:     Physical Exam Vitals and nursing note reviewed.  Constitutional:      Appearance: He is well-developed.     Comments: No acute distress  HENT:     Head: Normocephalic and atraumatic.     Nose: Nose normal.  Eyes:     Conjunctiva/sclera: Conjunctivae normal.  Cardiovascular:     Rate and  Rhythm: Normal rate.  Pulmonary:     Effort: Pulmonary effort is normal. No respiratory distress.  Abdominal:     General: There is no distension.  Musculoskeletal:        General: Normal range of motion.     Cervical back: Neck supple.     Comments: Right lower leg: Mild swelling about the calf compared to left, bruising noted to medial aspect of right foot, mild tenderness to palpation over this area, increased tenderness throughout belly of calf, Achilles feels intact, negative Thompson's; full active range of motion of knee, nontender to palpation over patella and medial lateral joint lines of right knee; nontender to palpation of the medial and lateral malleolus, dorsalis pedis 2+  Skin:    General: Skin is warm and dry.  Neurological:     Mental Status: He is alert and oriented to person, place, and time.      UC Treatments / Results  Labs (all labs ordered are listed, but only abnormal results are displayed) Labs Reviewed - No data to  display  EKG   Radiology DG Tibia/Fibula Right  Result Date: 01/04/2020 CLINICAL DATA:  Injury 5 days ago, pain and burning EXAM: RIGHT TIBIA AND FIBULA - 2 VIEW COMPARISON:  None. FINDINGS: There is no evidence of fracture or other focal bone lesions. Soft tissues are unremarkable. IMPRESSION: No fracture or dislocation of the right tibia or fibula. Electronically Signed   By: Eddie Candle M.D.   On: 01/04/2020 08:55    Procedures Procedures (including critical care time)  Medications Ordered in UC Medications - No data to display  Initial Impression / Assessment and Plan / UC Course  I have reviewed the triage vital signs and the nursing notes.  Pertinent labs & imaging results that were available during my care of the patient were reviewed by me and considered in my medical decision making (see chart for details).     X-ray negative, suspect most likely gastrocnemius tear/strain.  Low suspicion of DVT at this time, but discussed with patient and discussed warning symptoms to follow-up with-increased pain redness swelling or warmth to the leg.  Recommending follow-up with sports medicine for outpatient ultrasound, continue anti-inflammatories, rest ice elevation weightbearing as tolerated.  Ace wrap applied to calf for compression.  Discussed strict return precautions. Patient verbalized understanding and is agreeable with plan.  Final Clinical Impressions(s) / UC Diagnoses   Final diagnoses:  Gastrocnemius muscle tear, right, initial encounter     Discharge Instructions     Please contact sports medicine to follow-up with-contact below Naprosyn twice daily with food May try gentle stretching using attached exercises Ice and elevate throughout the day as you are able Weight-bear as tolerated, may use crutches to help with walking  Please follow-up if any symptoms not improving or worsening, developing increased leg swelling redness or warmth to touch    ED  Prescriptions    Medication Sig Dispense Auth. Provider   naproxen (NAPROSYN) 500 MG tablet Take 1 tablet (500 mg total) by mouth 2 (two) times daily. 30 tablet Kjerstin Abrigo, [place] C, PA-C     PDMP not reviewed this encounter.   Janith Lima, [place] 01/04/20 [phone]

## 2020-01-06 ENCOUNTER — Ambulatory Visit (INDEPENDENT_AMBULATORY_CARE_PROVIDER_SITE_OTHER): Payer: Self-pay | Admitting: Sports Medicine

## 2020-01-06 ENCOUNTER — Encounter: Payer: Self-pay | Admitting: Sports Medicine

## 2020-01-06 ENCOUNTER — Ambulatory Visit: Payer: Self-pay

## 2020-01-06 ENCOUNTER — Other Ambulatory Visit: Payer: Self-pay

## 2020-01-06 VITALS — BP 142/81 | Ht 65.0 in | Wt 167.0 lb

## 2020-01-06 DIAGNOSIS — M79661 Pain in right lower leg: Secondary | ICD-10-CM

## 2020-01-06 NOTE — Progress Notes (Addendum)
   [facility] [address] [place], [place] [address] Phone: [phone] Fax: [phone]   Patient Name: Evan Nunez Date of Birth: 1976/07/01 Medical Record Number: [id number] Gender: male Date of Encounter: 01/06/2020  SUBJECTIVE:      Chief Complaint:  Right calf pain   HPI:  Patient is a 44 year old gentleman presenting with right calf pain after playing tennis 1 week ago and landing awkwardly feeling a pulling sensation in the back of his leg.  He has had pain and swelling since that time.  He was seen in the urgent care 2 days ago where he had an x-ray that was negative for fracture and given an ace bandage wrap.  He denies any numbness or tingling into his toes.  No prior injury to the calf.  He is utilizing crutches for ambulation.   DOI: 12/28/2019   ROS:     See HPI.   PERTINENT  PMH / PSH / FH / SH:  Past Medical, Surgical, Social, and Family History Reviewed & Updated in the EMR. Pertinent findings include:  Tobacco use   OBJECTIVE:  BP (!) 142/81   Ht 5\' 5"  (1.651 m)   Wt 167 lb (75.8 kg)   BMI 27.79 kg/m  Physical Exam:  Vital signs are reviewed.   GEN: Alert and oriented, NAD Pulm: Breathing unlabored PSY: normal mood, congruent affect  MSK: Right leg Swelling at posterior medial calf Bruising along ankle and lateral foot TTP at medial calf Able to actively plantar flex Negative Thompson test NVI  Limited MSK ultrasound Right calf The right medial gastrocnemius was visualized in long axis demonstrated a severely large hematoma above the fascial plane.  Impression: Right gastrocnemius tear with hematoma   ASSESSMENT & PLAN:   1. Partial right gastrocnemius tear with hematoma  Given that patient is altering his gait so much when walking, I recommended he continue to use the crutches until he sees me next week.  We fitted him with a calf compression sleeve and start gentle range of motion exercises.  I will  plan to see him back in 1 week at which time I am hopeful to place heel lifts and have him stop using crutches.  Continue to use NSAIDs as needed.   , DO, ATC Sports Medicine Fellow  Addendum:  I was the preceptor for this visit and available for immediate consultation.  Judge Stall MD [facility]

## 2020-01-13 ENCOUNTER — Encounter: Payer: Self-pay | Admitting: Sports Medicine

## 2020-01-13 ENCOUNTER — Other Ambulatory Visit: Payer: Self-pay

## 2020-01-13 ENCOUNTER — Ambulatory Visit (INDEPENDENT_AMBULATORY_CARE_PROVIDER_SITE_OTHER): Payer: Self-pay | Admitting: Sports Medicine

## 2020-01-13 VITALS — BP 125/72 | Ht 65.0 in | Wt 167.0 lb

## 2020-01-13 DIAGNOSIS — M79661 Pain in right lower leg: Secondary | ICD-10-CM

## 2020-01-13 NOTE — Patient Instructions (Signed)
start taking walks three times each day for 5-10 minutes at a time, if you find yourself limping please do less amount of time Please purchase a calf compression sleeve, you may be able to find these online You can do light exercises of calf raises on a flat surface Use the heel lift that was placed in your shoe today when walking I will plan to see you back in 2 weeks

## 2020-01-13 NOTE — Progress Notes (Addendum)
   [facility] [address] [place], [place] [address] Phone: [phone] Fax: [phone]   Patient Name: Evan Nunez Date of Birth: Jun 07, 1976 Medical Record Number: [id number] Gender: male Date of Encounter: 01/13/2020  SUBJECTIVE:      Chief Complaint:  Right calf  Date of injury: 12/28/2019   HPI:  Patient is following up for right calf tear.  He had purchased more of a compression sock and not using a full compression sleeve.  He has had moments where he is able to walk without the crutches.  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  Is using anti-inflammatory as needed.  No new injury.  Overall he definitely feels better than when it first happened.  He admits the first week after the initial injury, he did not take it easy as he should have.     ROS:     See HPI.   PERTINENT  PMH / PSH / FH / SH:  Past Medical, Surgical, Social, and Family History Reviewed & Updated in the EMR.    OBJECTIVE:  BP 125/72   Ht 5\' 5"  (1.651 m)   Wt 167 lb (75.8 kg)   BMI 27.79 kg/m  Physical Exam:  Vital signs are reviewed.   GEN: Alert and oriented, NAD Pulm: Breathing unlabored PSY: normal mood, congruent affect  MSK: Right leg Swelling at posterior medial calf Bruising along ankle and lateral foot TTP at medial calf Able to actively plantar flex Negative Thompson test NVI  Limited MSK ultrasound (handheld bedside) Right calf The right medial gastrocnemius was visualized in long axis demonstrated a severely large hematoma above the fascial plane that has improved since last visit.  ASSESSMENT & PLAN:   1. Partial right gastrocnemius tear with hematoma  I explained to the patient that a compression sleeve would be more beneficial than the socks he is wearing.  He can look online for this purchase.  I also recommended he start to do very short 5-minute walks on a flat surface a couple times of day.  Over the  next 2 weeks, he can try to wean off of the crutches as pain allows.  I explained as long as he does not have too much of a limp, this is safe to do.  I also placed heel lifts in his right shoe.  We will follow-up with me in 2 weeks.   , DO, ATC Sports Medicine Fellow  Addendum:  I was the preceptor for this visit and available for immediate consultation.  Judge Stall MD [facility]

## 2020-01-27 ENCOUNTER — Ambulatory Visit: Payer: Self-pay | Admitting: Sports Medicine

## 2020-02-08 ENCOUNTER — Ambulatory Visit: Payer: Self-pay | Admitting: Family Medicine

## 2020-03-16 ENCOUNTER — Encounter: Payer: Self-pay | Admitting: Family Medicine

## 2020-03-16 ENCOUNTER — Ambulatory Visit (INDEPENDENT_AMBULATORY_CARE_PROVIDER_SITE_OTHER): Payer: Self-pay | Admitting: Family Medicine

## 2020-03-16 ENCOUNTER — Other Ambulatory Visit: Payer: Self-pay

## 2020-03-16 DIAGNOSIS — S86309A Unspecified injury of muscle(s) and tendon(s) of peroneal muscle group at lower leg level, unspecified leg, initial encounter: Secondary | ICD-10-CM | POA: Insufficient documentation

## 2020-03-16 DIAGNOSIS — S86301A Unspecified injury of muscle(s) and tendon(s) of peroneal muscle group at lower leg level, right leg, initial encounter: Secondary | ICD-10-CM

## 2020-03-16 NOTE — Patient Instructions (Signed)
You strained your peroneal tendons and have a very small partial tear of one of them. This should heal well with conservative treatment. Icing 15 minutes at a time 3-4 times a day. Aleve 2 tabs twice a day with food for pain and inflammation. Boot when up and walking around. Come out of this boot at least twice a day to do motion exercises. Do strengthening exercises once a day 3 sets of 10. Consider compression sleeve as well to help with the swelling. Light duty as written. Follow up with me in 4 weeks for reevaluation.

## 2020-03-16 NOTE — Assessment & Plan Note (Signed)
Given history, clinical presentation, and exam his peroneal tendons are inflamed and show evidence of a tendonitis. Explained to patient this will improve with rest and rehab. - Band ankle exercises three times per week - Boot when up and walking around - Limit work activities such as walking/standing and give breaks for next 3-4 weeks - Alleve two tabs BID scheduled for next 2 weeks then PRN -F/u in 4 weeks

## 2020-03-16 NOTE — Progress Notes (Signed)
  SUBJECTIVE:   CHIEF COMPLAINT / HPI:   Right Foot Pain Evan Nunez is a pleasant 44y/o male who presents today for right foot pain on the outside of his foot and dorsally.  He states he has had this pain since he injured his calf over 2 months ago.  He states overall his calf is improved and he is able to walk better however he is still noticeably limping on exam today.  He states that the area is not tender or sore when he pushes on it or when someone else does however when he tries to walk or put any weight on it he has pain on the lateral aspect of the ankle with some pain plantar forefoot area.  He endorses no falls or injuries since he was last seen in this clinic.  He has had no ankle injury and he states he has not rolled his ankle in any way.  PERTINENT  PMH / PSH: none  OBJECTIVE:   BP 128/78   Ht 5\' 5"  (1.651 m)   Wt 167 lb (75.8 kg)   BMI 27.79 kg/m   MSK: Ankle/Foot, Right: No visible erythema, swelling, ecchymosis, or bony deformity. Very mild tenderness at the head of the 2nd metatarsal. Mild tenderness at the peroneal tendons lateral ankle. No other tenderness. No evidence of tibiotalar deviation; Range of motion is full in all directions. Strength is 5/5 in all directions. Stable lateral and medial ligaments; Unremarkable kleiger test; Able to walk 4 steps but has noticeable limp. Special Tests:   - Anterior Drawer test: NEG   - Talar Tilt test: Equivocal   - Kleiger test: NEG   - Syndesmotic Squeeze test: NEG   Limited MSK U/S: Limited MSK ultrasound of the right lower extremity and right ankle did show right gastrocnemius muscle with fiber disruption at the distal portion with hypoechoic fluid still remaining between the gastrocnemius and soleus muscles. Peroneal tendons viewed with minor fiber disruption in the peroneal brevis seen and both tendons surrounded by hypoechoic fluid. Findings consistent with healing hematoma of right gastrocnemius muscle and strain of the  peroneal tendon with minor tear in the peroneus brevis.  ASSESSMENT/PLAN:   Peroneal tendon injury Given history, clinical presentation, and exam his peroneal tendons are inflamed and show evidence of a tendonitis. Explained to patient this will improve with rest and rehab. - Band ankle exercises three times per week - Boot when up and walking around - Limit work activities such as walking/standing and give breaks for next 3-4 weeks - Aleve two tabs BID scheduled for next 2 weeks then PRN -F/u in 4 weeks      , DO PGY-4, Sports Medicine Fellow [facility]

## 2020-04-13 ENCOUNTER — Ambulatory Visit: Payer: Self-pay | Admitting: Family Medicine

## 2020-05-09 ENCOUNTER — Encounter: Payer: Self-pay | Admitting: Family Medicine

## 2020-05-09 ENCOUNTER — Other Ambulatory Visit: Payer: Self-pay

## 2020-05-09 ENCOUNTER — Ambulatory Visit (INDEPENDENT_AMBULATORY_CARE_PROVIDER_SITE_OTHER): Payer: Self-pay | Admitting: Family Medicine

## 2020-05-09 VITALS — BP 135/79 | Ht 62.0 in | Wt 170.0 lb

## 2020-05-09 DIAGNOSIS — G8929 Other chronic pain: Secondary | ICD-10-CM

## 2020-05-09 DIAGNOSIS — M25571 Pain in right ankle and joints of right foot: Secondary | ICD-10-CM

## 2020-05-09 MED ORDER — NAPROXEN 500 MG PO TABS: 500.0000 mg | ORAL_TABLET | Freq: Two times a day (BID) | ORAL | 2 refills | Status: DC | PRN

## 2020-05-09 NOTE — Patient Instructions (Signed)
You strained your peroneal tendons and have a very small partial tear of one of them. This should heal well with conservative treatment. Icing 15 minutes at a time 3-4 times a day. Naproxen twice a day with food for pain and inflammation. Switch to supportive shoes with sports insoles, small scaphoid pads in them. You can switch the insoles into different shoes when needed. Continue home exercises for these tendons. Ideally you start physical therapy as well. Follow up with me in 6 weeks for reevaluation.

## 2020-05-09 NOTE — Progress Notes (Signed)
PCP: Patient, No Pcp Per  Subjective:   HPI: Patient is a 44 y.o. male here for right foot/leg pain.  8/18: Evan Nunez is a pleasant 44y/o male who presents today for right foot pain on the outside of his foot and dorsally.  He states he has had this pain since he injured his calf over 2 months ago.  He states overall his calf is improved and he is able to walk better however he is still noticeably limping on exam today.  He states that the area is not tender or sore when he pushes on it or when someone else does however when he tries to walk or put any weight on it he has pain on the lateral aspect of the ankle with some pain plantar forefoot area.  He endorses no falls or injuries since he was last seen in this clinic.  He has had no ankle injury and he states he has not rolled his ankle in any way.  10/11: Patient reports his calf has improved- no pain here now. However most of pain still lateral right ankle and now into dorsal foot, 3rd digit. Has been wearing boot and taking naproxen. Pain ok at beginning of day but by end of work day is still quite painful. Associated swelling lateral ankle. No new injuries.  History reviewed. No pertinent past medical history.  No current outpatient medications on file prior to visit.   No current facility-administered medications on file prior to visit.    History reviewed. No pertinent surgical history.  No Known Allergies  Social History   Socioeconomic History  . Marital status: Married    Spouse name: Not on file  . Number of children: Not on file  . Years of education: Not on file  . Highest education level: Not on file  Occupational History  . Not on file  Tobacco Use  . Smoking status: Current Some Day Smoker    Types: Cigarettes  . Smokeless tobacco: Never Used  Vaping Use  . Vaping Use: Never assessed  Substance and Sexual Activity  . Alcohol use: Yes  . Drug use: No  . Sexual activity: Not on file  Other Topics  Concern  . Not on file  Social History Narrative  . Not on file   Social Determinants of Health   Financial Resource Strain:   . Difficulty of Paying Living Expenses: Not on file  Food Insecurity:   . Worried About Programme researcher, broadcasting/film/video in the Last Year: Not on file  . Ran Out of Food in the Last Year: Not on file  Transportation Needs:   . Lack of Transportation (Medical): Not on file  . Lack of Transportation (Non-Medical): Not on file  Physical Activity:   . Days of Exercise per Week: Not on file  . Minutes of Exercise per Session: Not on file  Stress:   . Feeling of Stress : Not on file  Social Connections:   . Frequency of Communication with Friends and Family: Not on file  . Frequency of Social Gatherings with Friends and Family: Not on file  . Attends Religious Services: Not on file  . Active Member of Clubs or Organizations: Not on file  . Attends Banker Meetings: Not on file  . Marital Status: Not on file  Intimate Partner Violence:   . Fear of Current or Ex-Partner: Not on file  . Emotionally Abused: Not on file  . Physically Abused: Not on file  .  Sexually Abused: Not on file    Family History  Problem Relation Age of Onset  . Healthy Mother     BP 135/79   Ht 5\' 2"  (1.575 m)   Wt 170 lb (77.1 kg)   BMI 31.09 kg/m   [facility] 05/09[facility]  Frequency of aerobic exercise (# of days/week) 3  Average time in minutes 30  Frequency of strengthening activities (# of days/week) 3    No flowsheet data found.  Review of Systems: See HPI above.     Objective:  Physical Exam:  Gen: NAD, comfortable in exam room  Right foot/ankle: Mild anterolateral swelling.  No bruising, other deformity. FROM with pain on resisted external rotation, 3rd digit flexion and extension TTP course of peroneus brevis, minimally over extensor digitorum.  No other tenderness. Negative ant drawer and talar tilt.   Negative syndesmotic  compression. Thompsons test negative. NV intact distally.  Limited MSK u/s right lower leg/ankle:  Separation with anechoic fluid still visible between distal gastroc and soleus though improving.  Decreased fluid in peroneal tendon sheaths, minimal neovascularity surrounding sheath.  No full thickness tears.  Extensor digitorum intact without abnormalities.   Assessment & Plan:  1. Right ankle pain - clinically healed from his gastroc tear.  Major issue now is peroneal tendon partial tear and tendinopathy.  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  Transition out of this to supportive shoes with sports insoles and scaphoid pads.  Icing, naproxen.  Unfortunately limited by lack of insurance coverage so cannot do formal PT but reviewed home exercise program again.  F/u in 6 weeks.

## 2020-06-20 ENCOUNTER — Ambulatory Visit: Payer: Self-pay | Admitting: Family Medicine

## 2020-11-02 ENCOUNTER — Ambulatory Visit (HOSPITAL_COMMUNITY)
Admission: EM | Admit: 2020-11-02 | Discharge: 2020-11-02 | Disposition: A | Discharge status: Home / Self Care | Payer: Self-pay | Attending: Urgent Care | Admitting: Urgent Care

## 2020-11-02 ENCOUNTER — Other Ambulatory Visit: Payer: Self-pay

## 2020-11-02 ENCOUNTER — Encounter (HOSPITAL_COMMUNITY): Payer: Self-pay | Admitting: *Deleted

## 2020-11-02 DIAGNOSIS — H8113 Benign paroxysmal vertigo, bilateral: Secondary | ICD-10-CM

## 2020-11-02 DIAGNOSIS — R109 Unspecified abdominal pain: Secondary | ICD-10-CM

## 2020-11-02 LAB — POCT URINALYSIS DIPSTICK, ED / UC
Bilirubin Urine: NEGATIVE
Glucose, UA: NEGATIVE mg/dL
Ketones, ur: NEGATIVE mg/dL
Leukocytes,Ua: NEGATIVE
Nitrite: NEGATIVE
Protein, ur: NEGATIVE mg/dL
Specific Gravity, Urine: 1.015 (ref 1.005–1.030)
Urobilinogen, UA: 0.2 mg/dL (ref 0.0–1.0)
pH: 7 (ref 5.0–8.0)

## 2020-11-02 MED ORDER — NAPROXEN 500 MG PO TABS: 500.0000 mg | ORAL_TABLET | Freq: Two times a day (BID) | ORAL | 0 refills | Status: DC

## 2020-11-02 MED ORDER — TAMSULOSIN HCL 0.4 MG PO CAPS: 0.4000 mg | ORAL_CAPSULE | Freq: Every day | ORAL | 0 refills | Status: DC

## 2020-11-02 MED ORDER — TIZANIDINE HCL 4 MG PO TABS: 4.0000 mg | ORAL_TABLET | Freq: Every day | ORAL | 0 refills | Status: DC

## 2020-11-02 MED ORDER — MECLIZINE HCL 12.5 MG PO TABS: 12.5000 mg | ORAL_TABLET | Freq: Three times a day (TID) | ORAL | 0 refills | Status: DC | PRN

## 2020-11-02 NOTE — ED Triage Notes (Signed)
Pt reports when he turns his head quickly to the side he feels dizzy. Pt reports he has had RT flank pain for months and months but is worse .

## 2020-11-02 NOTE — ED Provider Notes (Signed)
[facility]   MRN: [id number] DOB: [birth year]  Subjective:   Evan Nunez is a 45 y.o. male presenting for 3 day history of acute onset intermittent dizziness for 2-3 minutes. Initially had chills, shob, nausea without vomiting but this has resolved. Still has episodes of dizziness. Has also had right flank tenderness for 3-4 months. Feels like a stinging sensation of his skin. Rubbing the area can improve the symptoms. Tries to hydrate well every day. No falls, trauma, hematuria, dysuria, urinary frequency, history of renal stone, rashes. No runny or stuffy nose, ear pain, sore throat, tinnitus.   No current facility-administered medications for this encounter.  Current Outpatient Medications:  .  naproxen (NAPROSYN) 500 MG tablet, Take 1 tablet (500 mg total) by mouth 2 (two) times daily as needed., Disp: 60 tablet, Rfl: 2   No Known Allergies  History reviewed. No pertinent past medical history.   History reviewed. No pertinent surgical history.  Family History  Problem Relation Age of Onset  . Healthy Mother     Social History   Tobacco Use  . Smoking status: Current Some Day Smoker    Types: Cigarettes  . Smokeless tobacco: Never Used  Substance Use Topics  . Alcohol use: Yes  . Drug use: No    ROS   Objective:   Vitals: BP 137/76 (BP Location: Right Arm)   Pulse 70   Temp 98.6 F (37 C) (Oral)   Resp 16   SpO2 98%   Physical Exam Constitutional:      General: He is not in acute distress.    Appearance: Normal appearance. He is well-developed and normal weight. He is not ill-appearing, toxic-appearing or diaphoretic.  HENT:     Head: Normocephalic and atraumatic.     Right Ear: Tympanic membrane, ear canal and external ear normal. There is no impacted cerumen.     Left Ear: Tympanic membrane, ear canal and external ear normal. There is no impacted cerumen.     Nose: Nose normal. No congestion or rhinorrhea.     Mouth/Throat:      Mouth: Mucous membranes are moist.     Pharynx: Oropharynx is clear. No oropharyngeal exudate or posterior oropharyngeal erythema.  Eyes:     General: No scleral icterus.       Right eye: No discharge.        Left eye: No discharge.     Extraocular Movements: Extraocular movements intact.     Conjunctiva/sclera: Conjunctivae normal.     Pupils: Pupils are equal, round, and reactive to light.  Cardiovascular:     Rate and Rhythm: Normal rate and regular rhythm.     Heart sounds: Normal heart sounds. No murmur heard. No friction rub. No gallop.   Pulmonary:     Effort: Pulmonary effort is normal. No respiratory distress.     Breath sounds: Normal breath sounds. No stridor. No wheezing, rhonchi or rales.  Abdominal:     General: Bowel sounds are normal. There is no distension.     Palpations: Abdomen is soft. There is no mass.     Tenderness: There is no abdominal tenderness. There is right CVA tenderness. There is no left CVA tenderness, guarding or rebound.     Hernia: No hernia is present.  Musculoskeletal:     Cervical back: Normal range of motion and neck supple. No rigidity. No muscular tenderness.  Neurological:     General: No focal deficit present.     Mental  Status: He is alert and oriented to person, place, and time.  Psychiatric:        Mood and Affect: Mood normal.        Behavior: Behavior normal.        Thought Content: Thought content normal.        Judgment: Judgment normal.     Results for orders placed or performed during the hospital encounter of 11/02/20 (from the past 24 hour(s))  POC Urinalysis dipstick     Status: Abnormal   Collection Time: 11/02/20  3:35 PM  Result Value Ref Range   Glucose, UA NEGATIVE NEGATIVE mg/dL   Bilirubin Urine NEGATIVE NEGATIVE   Ketones, ur NEGATIVE NEGATIVE mg/dL   Specific Gravity, Urine 1.015 1.005 - 1.030   Hgb urine dipstick TRACE (A) NEGATIVE   pH 7.0 5.0 - 8.0   Protein, ur NEGATIVE NEGATIVE mg/dL   Urobilinogen, UA  0.2 0.0 - 1.0 mg/dL   Nitrite NEGATIVE NEGATIVE   Leukocytes,Ua NEGATIVE NEGATIVE    Assessment and Plan :   PDMP not reviewed this encounter.  1. BPPV (benign paroxysmal positional vertigo), bilateral   2. Right flank pain     Will manage for BPPV, anticipatory guidance provided. Suspect renal colic for his right flank pain. Start tamsulosin, hydrate very well.  Offered naproxen and tizanidine for the pain. Counseled patient on potential for adverse effects with medications prescribed/recommended today, ER and return-to-clinic precautions discussed, patient verbalized understanding.    Wallis Bamberg, PA-C 11/02/20 1606

## 2020-12-06 NOTE — Progress Notes (Deleted)
    Subjective:    CC: R elbow pain  I, Dalanie Kisner, LAT, ATC, am serving as scribe for Dr. Clementeen Graham.  HPI: Pt is a 45 y/o male presenting w/ c/o R elbow pain x .  He locates his pain to .  Radiating pain: R elbow swelling: Aggravating factors: Treatments tried:  Pertinent review of Systems: ***  Relevant historical information: ***   Objective:   There were no vitals filed for this visit. General: Well Developed, well nourished, and in no acute distress.   MSK: ***  Lab and Radiology Results No results found for this or any previous visit (from the past 72 hour(s)). No results found.    Impression and Recommendations:    Assessment and Plan: 44 y.o. male with ***.  PDMP not reviewed this encounter. No orders of the defined types were placed in this encounter.  No orders of the defined types were placed in this encounter.   Discussed warning signs or symptoms. Please see discharge instructions. Patient expresses understanding.   ***

## 2020-12-07 ENCOUNTER — Other Ambulatory Visit: Payer: Self-pay

## 2020-12-07 ENCOUNTER — Ambulatory Visit (INDEPENDENT_AMBULATORY_CARE_PROVIDER_SITE_OTHER): Payer: Self-pay | Admitting: Family Medicine

## 2020-12-07 ENCOUNTER — Encounter: Payer: Self-pay | Admitting: Family Medicine

## 2020-12-07 ENCOUNTER — Ambulatory Visit: Payer: Self-pay | Admitting: Family Medicine

## 2020-12-07 ENCOUNTER — Ambulatory Visit: Payer: Self-pay

## 2020-12-07 VITALS — BP 128/80 | Ht 65.0 in | Wt 170.0 lb

## 2020-12-07 DIAGNOSIS — M25521 Pain in right elbow: Secondary | ICD-10-CM

## 2020-12-07 MED ORDER — METHYLPREDNISOLONE ACETATE 40 MG/ML IJ SUSP
40.0000 mg | Freq: Once | INTRAMUSCULAR | Status: AC
  Administered 2020-12-07: 40 mg via INTRA_ARTICULAR

## 2020-12-07 NOTE — Patient Instructions (Addendum)
You have lateral epicondylitis Try to avoid painful activities as much as possible. Ice the area 3-4 times a day for 15 minutes at a time. You were given an injection today. Tylenol (500mg  1-2 tabs three times a day) and/or aleve (1-2 tabs twice a day with food) as needed for pain. Counterforce brace as directed can help unload area - wear this regularly if it provides you with relief. Hammer rotation exercise, wrist extension exercise with 1 pound weight - 3 sets of 10 once a day.   Stretching - hold for 20-30 seconds and repeat 3 times. Consider physical therapy, nitro patches if not improving. Follow up in 1 month.

## 2020-12-07 NOTE — Progress Notes (Signed)
PCP: Patient, No Pcp Per (Inactive)  Subjective:   HPI: Patient is a 45 y.o. male here for right elbow pain.  Patient reports for about 3 months he's had lateral right elbow pain. No acute injury or trauma. Has progressed where it's difficult to move right elbow due to pain. 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 Pain is a burning as well but no numbness or tingling. Worse in mornings also. Tried tylenol without much benefit. Right handed.  History reviewed. No pertinent past medical history.  Current Outpatient Medications on File Prior to Visit  Medication Sig Dispense Refill  . meclizine (ANTIVERT) 12.5 MG tablet Take 1 tablet (12.5 mg total) by mouth 3 (three) times daily as needed for dizziness. 30 tablet 0  . naproxen (NAPROSYN) 500 MG tablet Take 1 tablet (500 mg total) by mouth 2 (two) times daily with a meal. 30 tablet 0  . tamsulosin (FLOMAX) 0.4 MG CAPS capsule Take 1 capsule (0.4 mg total) by mouth daily after supper. 30 capsule 0  . tiZANidine (ZANAFLEX) 4 MG tablet Take 1 tablet (4 mg total) by mouth at bedtime. 30 tablet 0   No current facility-administered medications on file prior to visit.    History reviewed. No pertinent surgical history.  No Known Allergies  Social History   Socioeconomic History  . Marital status: Married    Spouse name: Not on file  . Number of children: Not on file  . Years of education: Not on file  . Highest education level: Not on file  Occupational History  . Not on file  Tobacco Use  . Smoking status: Current Some Day Smoker    Types: Cigarettes  . Smokeless tobacco: Never Used  Vaping Use  . Vaping Use: Not on file  Substance and Sexual Activity  . Alcohol use: Yes  . Drug use: No  . Sexual activity: Not on file  Other Topics Concern  . Not on file  Social History Narrative  . Not on file   Social Determinants of Health   Financial Resource Strain: Not on file  Food Insecurity: Not on file   Transportation Needs: Not on file  Physical Activity: Not on file  Stress: Not on file  Social Connections: Not on file  Intimate Partner Violence: Not on file    Family History  Problem Relation Age of Onset  . Healthy Mother     BP 128/80   Ht 5\' 5"  (1.651 m)   Wt 170 lb (77.1 kg)   BMI 28.29 kg/m   [facility] 05/09/2020  Frequency of aerobic exercise (# of days/week) 3  Average time in minutes 30  Frequency of strengthening activities (# of days/week) 3    No flowsheet data found.  Review of Systems: See HPI above.     Objective:  Physical Exam:  Gen: NAD, comfortable in exam room  Right elbow: No deformity, swelling, bruising. FROM with 5/5 strength - pain resisted 3rd digit extension more than wrist extension and supination. Tenderness to palpation lateral epicondyle.  No other tenderness. NVI distally. Collateral ligaments intact.   Assessment & Plan:  1. Right lateral epicondylitis - shown home exercises and stretches to do daily.  counterforce brace.  Icing, aleve 1-2 tabs twice a day with food.  Given level of pain he wanted to go ahead with injection today which was given under ultrasound guidance.  F/u in 1 month.  Out of work through the weekend.  After informed  written consent timeout was performed.  Patient was lying supine on exam table.  Area overlying right lateral epicondyle prepped with alcohol swab then utilizing ultrasound guidance, patient's right common extensor tendon was injected with 1:1 lidocaine: depomedrol with multiple needle fenestrations.  Patient tolerated procedure well without immediate complications.

## 2021-03-21 ENCOUNTER — Other Ambulatory Visit: Payer: Self-pay

## 2021-03-21 ENCOUNTER — Ambulatory Visit (INDEPENDENT_AMBULATORY_CARE_PROVIDER_SITE_OTHER): Payer: Self-pay

## 2021-03-21 ENCOUNTER — Encounter (HOSPITAL_COMMUNITY): Payer: Self-pay | Admitting: Emergency Medicine

## 2021-03-21 ENCOUNTER — Ambulatory Visit (HOSPITAL_COMMUNITY)
Admission: EM | Admit: 2021-03-21 | Discharge: 2021-03-21 | Disposition: A | Discharge status: Home / Self Care | Payer: Self-pay | Attending: Urgent Care | Admitting: Urgent Care

## 2021-03-21 DIAGNOSIS — M25521 Pain in right elbow: Secondary | ICD-10-CM

## 2021-03-21 DIAGNOSIS — M7711 Lateral epicondylitis, right elbow: Secondary | ICD-10-CM

## 2021-03-21 MED ORDER — MELOXICAM 15 MG PO TABS: 15.0000 mg | ORAL_TABLET | Freq: Every day | ORAL | 1 refills | Status: DC

## 2021-03-21 NOTE — ED Triage Notes (Signed)
Pt presents with right elbow pain. States unable to extend arm.

## 2021-03-21 NOTE — ED Provider Notes (Signed)
[facility]   MRN: [id number] DOB: 02/17/1976  Subjective:   Evan Nunez is a 45 y.o. male presenting for 1 week history of acute onset recurrent worsening right elbow pain over the lateral epicondyle.  Patient has previously been seen by Ortho, had a lidocaine steroid injection done to the area in May.  States that this did help him for a time but is now worse.  Has had difficulty extending his arm, pronating, using his hands as well.  He does work at [employer] and has to use his arms consistently which worsens the problem.  Has used over-the-counter pain medication with minimal relief.  Denies fever, redness, hot sensation, trauma, fall.  No rashes.  No current facility-administered medications for this encounter.  Current Outpatient Medications:    meclizine (ANTIVERT) 12.5 MG tablet, Take 1 tablet (12.5 mg total) by mouth 3 (three) times daily as needed for dizziness., Disp: 30 tablet, Rfl: 0   naproxen (NAPROSYN) 500 MG tablet, Take 1 tablet (500 mg total) by mouth 2 (two) times daily with a meal., Disp: 30 tablet, Rfl: 0   tamsulosin (FLOMAX) 0.4 MG CAPS capsule, Take 1 capsule (0.4 mg total) by mouth daily after supper., Disp: 30 capsule, Rfl: 0   tiZANidine (ZANAFLEX) 4 MG tablet, Take 1 tablet (4 mg total) by mouth at bedtime., Disp: 30 tablet, Rfl: 0   No Known Allergies  History reviewed. No pertinent past medical history.   History reviewed. No pertinent surgical history.  Family History  Problem Relation Age of Onset   Healthy Mother     Social History   Tobacco Use   Smoking status: Some Days    Types: Cigarettes   Smokeless tobacco: Never  Substance Use Topics   Alcohol use: Yes   Drug use: No    ROS   Objective:   Vitals: BP (!) 152/88 (BP Location: Left Arm)   Pulse 65   Temp 98.1 F (36.7 C) (Oral)   Resp 16   SpO2 98%   Physical Exam Constitutional:      General: He is not in acute distress.    Appearance: Normal appearance.  He is well-developed and normal weight. He is not ill-appearing, toxic-appearing or diaphoretic.  HENT:     Head: Normocephalic and atraumatic.     Right Ear: External ear normal.     Left Ear: External ear normal.     Nose: Nose normal.     Mouth/Throat:     Pharynx: Oropharynx is clear.  Eyes:     General: No scleral icterus.       Right eye: No discharge.        Left eye: No discharge.     Extraocular Movements: Extraocular movements intact.     Pupils: Pupils are equal, round, and reactive to light.  Cardiovascular:     Rate and Rhythm: Normal rate.  Pulmonary:     Effort: Pulmonary effort is normal.  Musculoskeletal:     Right elbow: No swelling, deformity, effusion or lacerations. Decreased range of motion (limited active range of motion, full passive range of motion). Tenderness present in lateral epicondyle. No radial head, medial epicondyle or olecranon process tenderness.     Right forearm: No swelling, edema, deformity, lacerations, tenderness or bony tenderness.     Cervical back: Normal range of motion.  Neurological:     Mental Status: He is alert and oriented to person, place, and time.  Psychiatric:  Mood and Affect: Mood normal.        Behavior: Behavior normal.        Thought Content: Thought content normal.        Judgment: Judgment normal.    DG Elbow Complete Right  Result Date: 03/21/2021 CLINICAL DATA:  Right elbow pain. EXAM: RIGHT ELBOW - COMPLETE 3+ VIEW COMPARISON:  None. FINDINGS: There is no evidence of fracture, dislocation, or joint effusion. There is no evidence of arthropathy or other focal bone abnormality. Soft tissues are unremarkable. IMPRESSION: Negative. Electronically Signed   By: Lupita Raider M.D.   On: 03/21/2021 09:30     Assessment and Plan :   PDMP not reviewed this encounter.  1. Lateral epicondylitis of right elbow   2. Right elbow pain     Patient has an appointment with his orthopedist tomorrow.  Recommended  meloxicam for pain and inflammation.  Discussed the nature of lateral epicondylitis and I do believe that he would be best served by getting management with Ortho as he previously has.  No signs of an emergent musculoskeletal problem. Counseled patient on potential for adverse effects with medications prescribed/recommended today, ER and return-to-clinic precautions discussed, patient verbalized understanding.    Wallis Bamberg, PA-C 03/21/21 1043

## 2021-03-22 ENCOUNTER — Encounter: Payer: Self-pay | Admitting: Family Medicine

## 2021-03-22 ENCOUNTER — Ambulatory Visit (INDEPENDENT_AMBULATORY_CARE_PROVIDER_SITE_OTHER): Payer: Self-pay | Admitting: Family Medicine

## 2021-03-22 VITALS — Ht 65.0 in | Wt 169.0 lb

## 2021-03-22 DIAGNOSIS — M25521 Pain in right elbow: Secondary | ICD-10-CM

## 2021-03-22 MED ORDER — METHYLPREDNISOLONE ACETATE 40 MG/ML IJ SUSP
40.0000 mg | Freq: Once | INTRAMUSCULAR | Status: AC
  Administered 2021-03-22: 40 mg via INTRA_ARTICULAR

## 2021-03-22 NOTE — Patient Instructions (Signed)
You have lateral epicondylitis Try to avoid painful activities as much as possible. Ice the area 3-4 times a day for 15 minutes at a time. Tylenol or aleve as needed for pain. Counterforce brace as directed can help unload area - wear this regularly if it provides you with relief. Hammer rotation exercise, wrist extension exercise with 1 pound weight - 3 sets of 10 once a day.   Stretching - hold for 20-30 seconds and repeat 3 times. You were given a repeat injection today. Start physical therapy for at least 1 visit to learn more extensive home program. Follow up in 1 month or as needed.

## 2021-03-22 NOTE — Progress Notes (Signed)
PCP: Patient, No Pcp Per (Inactive)  Subjective:   HPI: Patient is a 45 y.o. male here for evaluation of right elbow pain.  The patient states that this is an acute on chronic injury.  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  He had a similar elbow pain about 3-4 months ago, and was diagnosed with lateral epicondylitis.  He did have a steroid injection at that time which did provide significant relief.  His elbow pain is located over the lateral epicondyle, he states it started about 2 days ago.  He denies any inciting event or trauma.  He was hurting so bad he did go to the urgent care, had x-rays performed which were negative for any fracture.  He did state that his elbow has been swollen, it is slightly better now that he has iced it and was given meloxicam in the urgent care.  However, his pain is still quite severe, worse with any activity and he does note diminished grip strength because of the pain.  He describes his pain as sharp but also has a burning sensation that radiates down the extensor compartment of his forearm, however he denies any numbness or tingling.  He denies any redness, warmth to the area.   No past medical history on file.   Ht 5\' 5"  (1.651 m)   Wt 169 lb (76.7 kg)   BMI 28.12 kg/m   [facility] 05/09/2020  Frequency of aerobic exercise (# of days/week) 3  Average time in minutes 30  Frequency of strengthening activities (# of days/week) 3    No flowsheet data found.  Review of Systems: See HPI above.     Objective:  Physical Exam:  Gen: Well-appearing, in no acute distress; non-toxic CV: Regular Rate. Well-perfused. Warm.  Resp: Breathing unlabored on room air; no wheezing. Psych: Fluid speech in conversation; appropriate affect; normal thought process Neuro: Sensation intact throughout. No gross coordination deficits.  MSK:   Right elbow: There is some soft tissue swelling  present; no redness, or ecchymosis.  No bony abnormality or deformity.  There is significant tenderness to palpation over the lateral epicondyle.  The patient does have slightly reduced strength with 4/5 in wrist extension, otherwise 5/5 strength intact.  Patient does have pain with third digit extension as well as resisted wrist extension and supination.  No tenderness to palpation over the medial epicondyle or olecranon.  Neurovascular intact.  Negative moving valgus stress test, no laxity.   Assessment & Plan:  1. Lateral epicondylitis, right elbow   Procedure: After informed written consent timeout was performed.  Patient was lying supine on exam table.  Area overlying right lateral epicondyle prepped with betadyne and alcohol swab then utilizing ultrasound guidance, patient's right common extensor tendon was injected with 2:1 lidocaine: depomedrol with multiple needle fenestrations.  Patient tolerated procedure well without immediate complications.  - U/S guided cortisone injection today - ice for 15-20 mins 3x daily; may use Meloxicam once daily with food - referral to formal physical therapy, exercises shown - continue to use counter-force brace - recommend activity modification vs resting from work as able - follow-up in 4-6 weeks, if still bothering, may consider ECSW therapy   07/09/2020, DO PGY-4, Sports Medicine Fellow [facility]

## 2021-04-12 ENCOUNTER — Encounter (HOSPITAL_COMMUNITY): Payer: Self-pay

## 2021-04-12 ENCOUNTER — Ambulatory Visit (HOSPITAL_COMMUNITY)
Admission: EM | Admit: 2021-04-12 | Discharge: 2021-04-12 | Disposition: A | Discharge status: Home / Self Care | Payer: Self-pay | Attending: Physician Assistant | Admitting: Physician Assistant

## 2021-04-12 DIAGNOSIS — M545 Low back pain, unspecified: Secondary | ICD-10-CM

## 2021-04-12 DIAGNOSIS — R030 Elevated blood-pressure reading, without diagnosis of hypertension: Secondary | ICD-10-CM

## 2021-04-12 LAB — POCT URINALYSIS DIPSTICK, ED / UC
Bilirubin Urine: NEGATIVE
Glucose, UA: NEGATIVE mg/dL
Hgb urine dipstick: NEGATIVE
Ketones, ur: NEGATIVE mg/dL
Leukocytes,Ua: NEGATIVE
Nitrite: NEGATIVE
Protein, ur: NEGATIVE mg/dL
Specific Gravity, Urine: <=1.005 (ref 1.005–1.030)
Urobilinogen, UA: 0.2 mg/dL (ref 0.0–1.0)
pH: 6 (ref 5.0–8.0)

## 2021-04-12 MED ORDER — METHOCARBAMOL 500 MG PO TABS: 500.0000 mg | ORAL_TABLET | Freq: Two times a day (BID) | ORAL | 0 refills | Status: DC | PRN

## 2021-04-12 MED ORDER — NAPROXEN 500 MG PO TABS: 500.0000 mg | ORAL_TABLET | Freq: Two times a day (BID) | ORAL | 0 refills | Status: DC

## 2021-04-12 NOTE — ED Triage Notes (Signed)
Pt reports right sided lower back pain since this morning. Pain is worse when moving.

## 2021-04-12 NOTE — ED Provider Notes (Signed)
[facility]    CSN: [id number] Arrival date & time: 04/12/21  1332      History   Chief Complaint Chief Complaint  Patient presents with   Back Pain    HPI Fran Mcree is a 45 y.o. male.   Patient presents today with a 1 day history of left-sided lower back pain.  He denies any known injury or increase in activity prior to symptom onset.  He does have a history of similar symptoms several months ago on the right but states these have improved and were never as severe as current symptoms.  Reports pain is rated 7 on a 0-10 pain scale, localized to left lower back without radiation, described as aching/burning, worse with forward flexion and extension of back, no alleviating factors identified.  He has tried Tylenol without improvement of symptoms.  He denies any urinary symptoms including hematuria, frequency, urgency.  He is unsure if he has a history of kidney stones because he has been told that back pain could be related to a kidney issue in the past.  He has not seen a urologist.  Denies any bowel/bladder incontinence, lower extremity weakness, saddle anesthesia, fever.  Denies history of malignancy.  In addition, patient's blood pressure was noted to be elevated today.  Assume this is related to pain as he is obviously uncomfortable.  He denies any chest pain, shortness of breath, headache, vision changes.  Denies history of hypertension has not taken antihypertensive medications in the past.   History reviewed. No pertinent past medical history.  Patient Active Problem List   Diagnosis Date Noted   Peroneal tendon injury 03/16/2020    History reviewed. No pertinent surgical history.     Home Medications    Prior to Admission medications   Medication Sig Start Date End Date Taking? Authorizing Provider  methocarbamol (ROBAXIN) 500 MG tablet Take 1 tablet (500 mg total) by mouth 2 (two) times daily as needed for muscle spasms. 04/12/21  Yes Bula Cavalieri, Noberto Retort, PA-C   meloxicam (MOBIC) 15 MG tablet Take 1 tablet (15 mg total) by mouth daily. 03/21/21   Wallis Bamberg, PA-C  naproxen (NAPROSYN) 500 MG tablet Take 1 tablet (500 mg total) by mouth 2 (two) times daily with a meal. 04/12/21   Torey Regan, Noberto Retort, PA-C    Family History Family History  Problem Relation Age of Onset   Healthy Mother     Social History Social History   Tobacco Use   Smoking status: Some Days    Types: Cigarettes   Smokeless tobacco: Never  Substance Use Topics   Alcohol use: Yes   Drug use: No     Allergies   Patient has no known allergies.   Review of Systems Review of Systems  Constitutional:  Positive for activity change. Negative for appetite change, fatigue and fever.  Eyes:  Negative for visual disturbance.  Respiratory:  Negative for cough and shortness of breath.   Cardiovascular:  Negative for chest pain.  Gastrointestinal:  Negative for abdominal pain, diarrhea, nausea and vomiting.  Genitourinary:  Negative for dysuria, frequency and hematuria.  Musculoskeletal:  Positive for back pain. Negative for arthralgias.  Neurological:  Negative for dizziness, weakness, light-headedness, numbness and headaches.    Physical Exam Triage Vital Signs ED Triage Vitals  Enc Vitals Group     BP 04/12/21 1453 (!) 150/89     Pulse Rate 04/12/21 1453 68     Resp 04/12/21 1453 18     Temp --  Temp Source 04/12/21 1453 Oral     SpO2 04/12/21 1453 97 %     Weight --      Height --      Head Circumference --      Peak Flow --      Pain Score 04/12/21 1452 7     Pain Loc --      Pain Edu? --      Excl. in GC? --    No data found.  Updated Vital Signs BP (!) 150/89 (BP Location: Right Arm)   Pulse 68   Resp 18   SpO2 97%   Visual Acuity Right Eye Distance:   Left Eye Distance:   Bilateral Distance:    Right Eye Near:   Left Eye Near:    Bilateral Near:     Physical Exam Vitals reviewed.  Constitutional:      General: He is awake.      Appearance: Normal appearance. He is normal weight. He is not ill-appearing.     Comments: Very pleasant male appears stated age in no acute distress sitting comfortably in exam room  HENT:     Head: Normocephalic and atraumatic.  Cardiovascular:     Rate and Rhythm: Normal rate and regular rhythm.     Heart sounds: Normal heart sounds, S1 normal and S2 normal. No murmur heard. Pulmonary:     Effort: Pulmonary effort is normal.     Breath sounds: Normal breath sounds. No stridor. No wheezing, rhonchi or rales.     Comments: Clear to auscultation bilaterally Abdominal:     General: Bowel sounds are normal.     Palpations: Abdomen is soft.     Tenderness: There is no abdominal tenderness. There is no right CVA tenderness, left CVA tenderness, guarding or rebound.     Comments: Benign abdominal exam; no CVA tenderness.  Musculoskeletal:     Cervical back: No tenderness or bony tenderness.     Thoracic back: No tenderness or bony tenderness.     Lumbar back: Bony tenderness present. No tenderness. Positive left straight leg raise test. Negative right straight leg raise test.     Comments: Back: No pain percussion of vertebrae.  Tenderness palpation of lumbar paraspinal muscles.  Positive straight leg raise on the left.  Negative Faber bilaterally.  Neurological:     Mental Status: He is alert.  Psychiatric:        Behavior: Behavior is cooperative.     UC Treatments / Results  Labs (all labs ordered are listed, but only abnormal results are displayed) Labs Reviewed  POCT URINALYSIS DIPSTICK, ED / UC    EKG   Radiology No results found.  Procedures Procedures (including critical care time)  Medications Ordered in UC Medications - No data to display  Initial Impression / Assessment and Plan / UC Course  I have reviewed the triage vital signs and the nursing notes.  Pertinent labs & imaging results that were available during my care of the patient were reviewed by me and  considered in my medical decision making (see chart for details).      UA was negative in clinic today.  Suspect musculoskeletal etiology given tenderness on exam in the presence of normal UA.  No indication for plain films as patient denies any recent trauma and has no bony tenderness on exam.  He was started on Naprosyn with instruction not to take NSAIDs with this medication due to risk of GI bleeding.  He was  also prescribed Robaxin with instruction not to drive or drink alcohol with taking this medication as drowsiness is a common side effect.  Encouraged him to use conservative treatment measures including heat, rest, stretch for symptom relief.  Discussed if symptoms or not improving we would need to consider imaging and/or physical therapy referral this would typically be arranged through his PCP.  Recommended follow-up with PCP if symptoms or not significantly improved within a week.  Discussed alarm symptoms that warrant emergent evaluation.  Strict return precautions given to which patient expressed understanding.  Patient was noted to have elevated blood pressure today.  He denies any signs/symptoms of endorgan damage.  Suspect this is related to pain.  He was encouraged to monitor this at home if this persistently above 140/90 be reevaluated.  Discussed that if blood pressure is elevated and he develops any chest pain, shortness of breath, headache, dizziness, vision changes he needs to go to the emergency room to which he expressed understanding.  Final Clinical Impressions(s) / UC Diagnoses   Final diagnoses:  Acute left-sided low back pain without sciatica  Elevated blood pressure reading     Discharge Instructions      Your urine was normal so I am not concerned about a urinary tract infection or kidney stone at this time.  I think you have a muscle strain.  Please take Naprosyn twice daily.  Do not take additional NSAIDs including aspirin, ibuprofen/Advil, naproxen/Aleve with  this medication as it can cause stomach bleeding.  You should also hold your Mobic/meloxicam.  You can use Tylenol for breakthrough pain.  I have also called in methocarbamol (Robaxin) which you can take up to twice a day as needed.  This will make you sleepy do not drive or drink alcohol while taking it.  Use heat, rest, stretch for additional symptom relief.  If your symptoms or not improving I would recommend follow-up as we can consider physical therapy referral or imaging.  This is typically arranged through primary care providers please follow-up with them if your symptoms persist.  If anything worsens please return immediately for reevaluation.     ED Prescriptions     Medication Sig Dispense Auth. Provider   naproxen (NAPROSYN) 500 MG tablet Take 1 tablet (500 mg total) by mouth 2 (two) times daily with a meal. 30 tablet Zanylah Hardie K, PA-C   methocarbamol (ROBAXIN) 500 MG tablet Take 1 tablet (500 mg total) by mouth 2 (two) times daily as needed for muscle spasms. 20 tablet Esabella Stockinger, Noberto Retort, PA-C      PDMP not reviewed this encounter.   Jeani Hawking, PA-C 04/12/21 1540

## 2021-04-12 NOTE — Discharge Instructions (Signed)
Your urine was normal so I am not concerned about a urinary tract infection or kidney stone at this time.  I think you have a muscle strain.  Please take Naprosyn twice daily.  Do not take additional NSAIDs including aspirin, ibuprofen/Advil, naproxen/Aleve with this medication as it can cause stomach bleeding.  You should also hold your Mobic/meloxicam.  You can use Tylenol for breakthrough pain.  I have also called in methocarbamol (Robaxin) which you can take up to twice a day as needed.  This will make you sleepy do not drive or drink alcohol while taking it.  Use heat, rest, stretch for additional symptom relief.  If your symptoms or not improving I would recommend follow-up as we can consider physical therapy referral or imaging.  This is typically arranged through primary care providers please follow-up with them if your symptoms persist.  If anything worsens please return immediately for reevaluation.

## 2021-08-21 ENCOUNTER — Ambulatory Visit (INDEPENDENT_AMBULATORY_CARE_PROVIDER_SITE_OTHER): Payer: Self-pay | Admitting: Family Medicine

## 2021-08-21 ENCOUNTER — Encounter: Payer: Self-pay | Admitting: Family Medicine

## 2021-08-21 VITALS — BP 148/88 | Ht 63.0 in | Wt 170.0 lb

## 2021-08-21 DIAGNOSIS — M7711 Lateral epicondylitis, right elbow: Secondary | ICD-10-CM

## 2021-08-21 HISTORY — DX: Lateral epicondylitis, right elbow: M77.11

## 2021-08-21 MED ORDER — PREDNISONE 10 MG PO TABS: ORAL_TABLET | ORAL | 0 refills | Status: DC

## 2021-08-21 NOTE — Progress Notes (Signed)
° ° °  SUBJECTIVE:   CHIEF COMPLAINT / HPI: right elbow pain  Right elbow pain has been ongoing for quite some time now.  Reports daily pain but last week started to get worse.  Last night pain was really bad and had to take 2 Naproxen which did help some.  Endorses pain and swelling at the lateral right elbow.  Using arm sling for comfort.  Reports has had three steroid injections in the past, the last on inn August helped but only lasted 3-5 months.  Has tried creams and elbow brace, nothing seems to help.  Had 6 treatments of physical therapy but had not been able to continue due to self pay.  Does have exercises at home but endorses not able to complete daily  PERTINENT  PMH / PSH:  Chronic Right epicondylitis   OBJECTIVE:   BP (!) 148/88    Ht 5\' 3"  (1.6 m)    Wt 170 lb (77.1 kg)    BMI 30.11 kg/m    General: Alert, no acute distress Elbow Inspection: No erythema, ecchymosis.  Mild lateral epicondylar swelling.  Active ROM: Limited range of motion to flexion, extension, pronation, supination secondary to pain Passive ROM: Limited range of motion to flexion, extension, pronation, supination secondary to pain  Strength: 5/5 strength to resisted flexion, extension  Tenderness: lateral epicondyle tenderness Special tests: Stable to varus/valgus stress. Pain with resisted wrist extension and finger extension. No pain with resisted wrist flexion. No interosseous tenderness or pain with maneuvers    ASSESSMENT/PLAN:   Lateral epicondylitis of right elbow Suspect flare of chronic condition.  Given chronicity of condition likely will benefit from surgery at this point. -Prednisone Taper Pack x 5 days -Patient to complete [employer] coverage paperwork, when complete to let clinic know.  Once approved will refer to [facility] for evaluation. -Note for work with 5 lbs restriction provided  , MD [facility]

## 2021-08-21 NOTE — Patient Instructions (Addendum)
Take the prednisone dose pack as directed for the next 6 days. Ok to take tylenol with this but don't take the naproxen or ibuprofen while you're on the prednisone. The day AFTER you finish prednisone you can resume taking the naproxen. Fill out the [employer] coverage paperwork - if/when this is approved call us and we will refer you to the surgeon to discuss surgery for this. Icing 15 minutes at a time 3-4 times a day. Work restrictions as noted.

## 2021-08-21 NOTE — Assessment & Plan Note (Signed)
Suspect flare of chronic condition.  Given chronicity of condition likely will benefit from surgery at this point. -Prednisone Taper Pack x 5 days -Patient to complete [employer] coverage paperwork, when complete to let clinic know.  Once approved will refer to [facility] for evaluation. -Note for work with 5 lbs restriction provided

## 2021-08-22 ENCOUNTER — Other Ambulatory Visit: Payer: Self-pay

## 2021-08-22 MED ORDER — PREDNISONE 10 MG PO TABS: ORAL_TABLET | ORAL | 0 refills | Status: DC

## 2021-08-22 NOTE — Progress Notes (Signed)
Pt states he picked up his prednisone yesterday, then misplaced it possibly lost it at another store.  He is asking for it be resent to [facility].    Rx sent to [facility].

## 2021-08-23 ENCOUNTER — Other Ambulatory Visit: Payer: Self-pay

## 2021-08-23 DIAGNOSIS — M7711 Lateral epicondylitis, right elbow: Secondary | ICD-10-CM

## 2021-08-23 NOTE — Progress Notes (Signed)
Pt called asking for referral to [facility] as discussed at his last appt. He will be self-pay.

## 2021-08-29 ENCOUNTER — Ambulatory Visit (INDEPENDENT_AMBULATORY_CARE_PROVIDER_SITE_OTHER): Payer: Self-pay

## 2021-08-29 ENCOUNTER — Encounter: Payer: Self-pay | Admitting: Orthopedic Surgery

## 2021-08-29 ENCOUNTER — Other Ambulatory Visit: Payer: Self-pay

## 2021-08-29 ENCOUNTER — Ambulatory Visit (INDEPENDENT_AMBULATORY_CARE_PROVIDER_SITE_OTHER): Payer: Self-pay | Admitting: Orthopedic Surgery

## 2021-08-29 VITALS — BP 143/93 | HR 82 | Ht 63.0 in | Wt 170.0 lb

## 2021-08-29 DIAGNOSIS — M7711 Lateral epicondylitis, right elbow: Secondary | ICD-10-CM

## 2021-08-29 NOTE — Progress Notes (Signed)
Office Visit Note   Patient: Evan Nunez           Date of Birth: 07/09/1976           MRN: [id number] Visit Date: 08/29/2021              Requested by: Lenda Kelp, MD [address] [place],  [place] [address] PCP: Patient, No Pcp Per (Inactive)   Assessment & Plan: Visit Diagnoses:  1. Lateral epicondylitis of right elbow     Plan: We discussed the diagnosis, prognosis, and both conservative and operative treatment options for lateral epicondylitis.  He has tried 3 corticosteroid injections, bracing, and physical therapy.  He is still having persistent symptoms that are quite bothersome and preventing him from working.   After our discussion, the patient has elected to proceed with open ECRB debridement.  We reviewed the benefits of surgery and the potential risks including, but not limited to, persistent symptoms, infection, damage to nearby nerves and blood vessels, delayed wound healing, and need for additional surgery.    All patient concerns and questions were addressed.  A surgical date will be confirmed with the patient.    Follow-Up Instructions: No follow-ups on file.   Orders:  Orders Placed This Encounter  Procedures   XR Elbow 2 Views Right   No orders of the defined types were placed in this encounter.     Procedures: No procedures performed   Clinical Data: No additional findings.   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  He denies any trauma or inciting event a year ago.  Symptoms started spontaneously.  He describes pain centered over the lateral epicondyle that is worse with certain motions such as picking up an object or while working in the kitchen.  Has been seen by the sports medicine  clinic and is undergone 3 corticosteroid ejections which have helped for about a month each.  His last injection was around October.  He done formal physical therapy and been using a home exercise program.  He is worn a brace.  These treatment methods have helped but the symptoms always recur.  He denies any mechanical symptoms such as locking, catching, or clicking.  He denies any subjective instability.  His symptoms are quite bothersome and are preventing him from working.  He has not been able to work in approximately 2 weeks.   Review of Systems   Objective: Vital Signs: BP (!) 143/93 (BP Location: Left Arm, Patient Position: Sitting)    Pulse 82    Ht 5\' 3"  (1.6 m)    Wt 170 lb (77.1 kg)    BMI 30.11 kg/m   Physical Exam Constitutional:      Appearance: Normal appearance.  Cardiovascular:     Rate and Rhythm: Normal rate.     Pulses: Normal pulses.  Pulmonary:     Effort: Pulmonary effort is normal.  Skin:    General: Skin is warm and dry.     Capillary Refill: Capillary refill takes less than 2 seconds.  Neurological:     Mental Status: He is alert.    Right Elbow Exam   Tenderness  The patient is experiencing tenderness in the lateral epicondyle.   Range of  Motion  °The patient has normal right elbow ROM. ° °Muscle Strength  °The patient has normal right elbow strength. ° °Tests  °Varus: negative °Valgus: negative °Tinel's sign (cubital tunnel): negative ° °Other  °Erythema: absent °Sensation: normal °Pulse: present ° °Comments:  TTP directly over lateral epicondyle and just distal with mild swelling.  No pain w/ flexion/extension/pronation/supination.  Reproduction of pain w/ resisted extension of middle finger and wrist w/ elbow extended.  No varus or valgus laxity or pain w/ varus/valgus stress.  ° ° ° ° °Specialty Comments:  °No specialty comments available. ° °Imaging: °No results found. ° ° °PMFS History: °Patient Active Problem List  ° Diagnosis Date Noted  ° Lateral  epicondylitis of right elbow 08/21/2021  ° Peroneal tendon injury 03/16/2020  ° °Past Medical History:  °Diagnosis Date  ° Lateral epicondylitis of right elbow 08/21/2021  °  °Family History  °Problem Relation Age of Onset  ° Healthy Mother   °  °No past surgical history on file. °Social History  ° °Occupational History  ° Not on file  °Tobacco Use  ° Smoking status: Some Days  °  Types: Cigarettes  ° Smokeless tobacco: Never  °Vaping Use  ° Vaping Use: Not on file  °Substance and Sexual Activity  ° Alcohol use: Yes  ° Drug use: No  ° Sexual activity: Not on file  ° ° ° ° ° ° °

## 2021-08-29 NOTE — H&P (View-Only) (Signed)
Office Visit Note   Patient: Evan Nunez           Date of Birth: 07/09/1976           MRN: [id number] Visit Date: 08/29/2021              Requested by: Lenda Kelp, MD [address] [place],  [place] [address] PCP: Patient, No Pcp Per (Inactive)   Assessment & Plan: Visit Diagnoses:  1. Lateral epicondylitis of right elbow     Plan: We discussed the diagnosis, prognosis, and both conservative and operative treatment options for lateral epicondylitis.  He has tried 3 corticosteroid injections, bracing, and physical therapy.  He is still having persistent symptoms that are quite bothersome and preventing him from working.   After our discussion, the patient has elected to proceed with open ECRB debridement.  We reviewed the benefits of surgery and the potential risks including, but not limited to, persistent symptoms, infection, damage to nearby nerves and blood vessels, delayed wound healing, and need for additional surgery.    All patient concerns and questions were addressed.  A surgical date will be confirmed with the patient.    Follow-Up Instructions: No follow-ups on file.   Orders:  Orders Placed This Encounter  Procedures   XR Elbow 2 Views Right   No orders of the defined types were placed in this encounter.     Procedures: No procedures performed   Clinical Data: No additional findings.   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  He denies any trauma or inciting event a year ago.  Symptoms started spontaneously.  He describes pain centered over the lateral epicondyle that is worse with certain motions such as picking up an object or while working in the kitchen.  Has been seen by the sports medicine  clinic and is undergone 3 corticosteroid ejections which have helped for about a month each.  His last injection was around October.  He done formal physical therapy and been using a home exercise program.  He is worn a brace.  These treatment methods have helped but the symptoms always recur.  He denies any mechanical symptoms such as locking, catching, or clicking.  He denies any subjective instability.  His symptoms are quite bothersome and are preventing him from working.  He has not been able to work in approximately 2 weeks.   Review of Systems   Objective: Vital Signs: BP (!) 143/93 (BP Location: Left Arm, Patient Position: Sitting)    Pulse 82    Ht 5\' 3"  (1.6 m)    Wt 170 lb (77.1 kg)    BMI 30.11 kg/m   Physical Exam Constitutional:      Appearance: Normal appearance.  Cardiovascular:     Rate and Rhythm: Normal rate.     Pulses: Normal pulses.  Pulmonary:     Effort: Pulmonary effort is normal.  Skin:    General: Skin is warm and dry.     Capillary Refill: Capillary refill takes less than 2 seconds.  Neurological:     Mental Status: He is alert.    Right Elbow Exam   Tenderness  The patient is experiencing tenderness in the lateral epicondyle.   Range of  Motion  The patient has normal right elbow ROM.  Muscle Strength  The patient has normal right elbow strength.  Tests  Varus: negative Valgus: negative Tinel's sign (cubital tunnel): negative  Other  Erythema: absent Sensation: normal Pulse: present  Comments:  TTP directly over lateral epicondyle and just distal with mild swelling.  No pain w/ flexion/extension/pronation/supination.  Reproduction of pain w/ resisted extension of middle finger and wrist w/ elbow extended.  No varus or valgus laxity or pain w/ varus/valgus stress.      Specialty Comments:  No specialty comments available.  Imaging: No results found.   PMFS History: Patient Active Problem List   Diagnosis Date Noted   Lateral  epicondylitis of right elbow 08/21/2021   Peroneal tendon injury 03/16/2020   Past Medical History:  Diagnosis Date   Lateral epicondylitis of right elbow 08/21/2021    Family History  Problem Relation Age of Onset   Healthy Mother     No past surgical history on file. Social History   Occupational History   Not on file  Tobacco Use   Smoking status: Some Days    Types: Cigarettes   Smokeless tobacco: Never  Vaping Use   Vaping Use: Not on file  Substance and Sexual Activity   Alcohol use: Yes   Drug use: No   Sexual activity: Not on file

## 2021-09-12 ENCOUNTER — Encounter (HOSPITAL_COMMUNITY): Payer: Self-pay | Admitting: Orthopedic Surgery

## 2021-09-12 ENCOUNTER — Other Ambulatory Visit: Payer: Self-pay

## 2021-09-12 NOTE — Progress Notes (Signed)
Evan Nunez denies chest pain or shortness of breath. Patient denies having any s/s of Covid in his household.  Patient denies any known exposure to Covid.   PCP is Dr Stephani Police.  I instructed Evan Nunez to shower with antibiotic soap, if it is available.  Dry off with a clean towel. Do not put lotion, powder, cologne or deodorant or makeup.No jewelry or piercings. Men may shave their face and neck. Woman should not shave. No nail polish, artificial or acrylic nails. Wear clean clothes, brush your teeth. Glasses, contact lens,dentures or partials may not be worn in the OR. If you need to wear them, please bring a case for glasses, do not wear contacts or bring a case, the hospital does not have contact cases, dentures or partials will have to be removed , make sure they are clean, we will provide a denture cup to put them in. You will need some one to drive you home and a responsible person over the age of 46 to stay with you for the first 24 hours after surgery.

## 2021-09-13 ENCOUNTER — Ambulatory Visit (HOSPITAL_BASED_OUTPATIENT_CLINIC_OR_DEPARTMENT_OTHER): Payer: Self-pay | Admitting: Physician Assistant

## 2021-09-13 ENCOUNTER — Ambulatory Visit (HOSPITAL_COMMUNITY)
Admission: RE | Admit: 2021-09-13 | Discharge: 2021-09-13 | Disposition: A | Discharge status: Home / Self Care | Payer: Self-pay | Attending: Orthopedic Surgery | Admitting: Orthopedic Surgery

## 2021-09-13 ENCOUNTER — Ambulatory Visit (HOSPITAL_COMMUNITY): Payer: Self-pay | Admitting: Physician Assistant

## 2021-09-13 ENCOUNTER — Encounter (HOSPITAL_COMMUNITY): Payer: Self-pay | Admitting: Orthopedic Surgery

## 2021-09-13 ENCOUNTER — Other Ambulatory Visit: Payer: Self-pay

## 2021-09-13 ENCOUNTER — Encounter (HOSPITAL_COMMUNITY)
Admission: RE | Disposition: A | Discharge status: Home / Self Care | Payer: Self-pay | Source: Home / Self Care | Attending: Orthopedic Surgery

## 2021-09-13 DIAGNOSIS — M7711 Lateral epicondylitis, right elbow: Secondary | ICD-10-CM

## 2021-09-13 DIAGNOSIS — Z6830 Body mass index (BMI) 30.0-30.9, adult: Secondary | ICD-10-CM | POA: Insufficient documentation

## 2021-09-13 DIAGNOSIS — E669 Obesity, unspecified: Secondary | ICD-10-CM | POA: Insufficient documentation

## 2021-09-13 HISTORY — DX: Personal history of urinary calculi: Z87.442

## 2021-09-13 HISTORY — PX: IRRIGATION AND DEBRIDEMENT ELBOW: SHX6886

## 2021-09-13 LAB — CBC
HCT: 47.8 % (ref 39.0–52.0)
Hemoglobin: 16.3 g/dL (ref 13.0–17.0)
MCH: 29.6 pg (ref 26.0–34.0)
MCHC: 34.1 g/dL (ref 30.0–36.0)
MCV: 86.8 fL (ref 80.0–100.0)
Platelets: 223 10*3/uL (ref 150–400)
RBC: 5.51 MIL/uL (ref 4.22–5.81)
RDW: 11.9 % (ref 11.5–15.5)
WBC: 5.3 10*3/uL (ref 4.0–10.5)
nRBC: 0 % (ref 0.0–0.2)

## 2021-09-13 SURGERY — IRRIGATION AND DEBRIDEMENT ELBOW
Anesthesia: Monitor Anesthesia Care | Site: Elbow | Laterality: Right

## 2021-09-13 MED ORDER — PROPOFOL 500 MG/50ML IV EMUL
INTRAVENOUS | Status: DC | PRN
Start: 2021-09-13 — End: 2021-09-13
  Administered 2021-09-13: 150 ug/kg/min via INTRAVENOUS

## 2021-09-13 MED ORDER — ORAL CARE MOUTH RINSE: 15.0000 mL | Freq: Once | OROMUCOSAL | Status: AC

## 2021-09-13 MED ORDER — FENTANYL CITRATE (PF) 100 MCG/2ML IJ SOLN
100.0000 ug | Freq: Once | INTRAMUSCULAR | Status: AC
  Filled 2021-09-13: qty 2

## 2021-09-13 MED ORDER — CEFAZOLIN SODIUM-DEXTROSE 2-4 GM/100ML-% IV SOLN
INTRAVENOUS | Status: AC
  Filled 2021-09-13: qty 100

## 2021-09-13 MED ORDER — 0.9 % SODIUM CHLORIDE (POUR BTL) OPTIME
TOPICAL | Status: DC | PRN
  Administered 2021-09-13: 1000 mL

## 2021-09-13 MED ORDER — FENTANYL CITRATE (PF) 100 MCG/2ML IJ SOLN
INTRAMUSCULAR | Status: AC
  Administered 2021-09-13: 100 ug via INTRAVENOUS
  Filled 2021-09-13: qty 2

## 2021-09-13 MED ORDER — MIDAZOLAM HCL 2 MG/2ML IJ SOLN
2.0000 mg | Freq: Once | INTRAMUSCULAR | Status: AC
  Filled 2021-09-13: qty 2

## 2021-09-13 MED ORDER — CHLORHEXIDINE GLUCONATE 0.12 % MT SOLN
OROMUCOSAL | Status: AC
  Administered 2021-09-13: 15 mL via OROMUCOSAL
  Filled 2021-09-13: qty 15

## 2021-09-13 MED ORDER — BUPIVACAINE HCL (PF) 0.25 % IJ SOLN
INTRAMUSCULAR | Status: AC
  Filled 2021-09-13: qty 30

## 2021-09-13 MED ORDER — DEXMEDETOMIDINE (PRECEDEX) IN NS 20 MCG/5ML (4 MCG/ML) IV SYRINGE
PREFILLED_SYRINGE | INTRAVENOUS | Status: DC | PRN
  Administered 2021-09-13: 8 ug via INTRAVENOUS
  Administered 2021-09-13: 12 ug via INTRAVENOUS

## 2021-09-13 MED ORDER — ONDANSETRON HCL 4 MG/2ML IJ SOLN: 4.0000 mg | Freq: Once | INTRAMUSCULAR | Status: DC | PRN

## 2021-09-13 MED ORDER — HYDROMORPHONE HCL 1 MG/ML IJ SOLN: 0.2500 mg | INTRAMUSCULAR | Status: DC | PRN

## 2021-09-13 MED ORDER — OXYCODONE HCL 5 MG/5ML PO SOLN: 5.0000 mg | Freq: Once | ORAL | Status: DC | PRN

## 2021-09-13 MED ORDER — ROPIVACAINE HCL 5 MG/ML IJ SOLN
INTRAMUSCULAR | Status: DC | PRN
  Administered 2021-09-13: 30 mL via PERINEURAL

## 2021-09-13 MED ORDER — MIDAZOLAM HCL 2 MG/2ML IJ SOLN
INTRAMUSCULAR | Status: AC
  Administered 2021-09-13: 2 mg via INTRAVENOUS
  Filled 2021-09-13: qty 2

## 2021-09-13 MED ORDER — CEFAZOLIN SODIUM-DEXTROSE 2-4 GM/100ML-% IV SOLN
2.0000 g | INTRAVENOUS | Status: AC
  Administered 2021-09-13: 2 g via INTRAVENOUS

## 2021-09-13 MED ORDER — FENTANYL CITRATE (PF) 250 MCG/5ML IJ SOLN
INTRAMUSCULAR | Status: AC
  Filled 2021-09-13: qty 5

## 2021-09-13 MED ORDER — FENTANYL CITRATE (PF) 100 MCG/2ML IJ SOLN
INTRAMUSCULAR | Status: DC | PRN
Start: 2021-09-13 — End: 2021-09-13
  Administered 2021-09-13 (×2): 25 ug via INTRAVENOUS

## 2021-09-13 MED ORDER — OXYCODONE HCL 5 MG PO TABS: 5.0000 mg | ORAL_TABLET | Freq: Four times a day (QID) | ORAL | 0 refills | Status: AC | PRN

## 2021-09-13 MED ORDER — OXYCODONE HCL 5 MG PO TABS: 5.0000 mg | ORAL_TABLET | Freq: Once | ORAL | Status: DC | PRN

## 2021-09-13 MED ORDER — LACTATED RINGERS IV SOLN: INTRAVENOUS | Status: DC

## 2021-09-13 MED ORDER — CHLORHEXIDINE GLUCONATE 0.12 % MT SOLN: 15.0000 mL | Freq: Once | OROMUCOSAL | Status: AC

## 2021-09-13 MED ORDER — LIDOCAINE 2% (20 MG/ML) 5 ML SYRINGE
INTRAMUSCULAR | Status: DC | PRN
  Administered 2021-09-13: 100 mg via INTRAVENOUS

## 2021-09-13 MED ORDER — PROPOFOL 10 MG/ML IV BOLUS
INTRAVENOUS | Status: DC | PRN
  Administered 2021-09-13: 50 mg via INTRAVENOUS

## 2021-09-13 SURGICAL SUPPLY — 55 items
BLADE SURG 15 STRL LF DISP TIS (BLADE) ×2 IMPLANT
BLADE SURG 15 STRL SS (BLADE) ×3
BNDG CONFORM 2 STRL LF (GAUZE/BANDAGES/DRESSINGS) IMPLANT
BNDG ELASTIC 2X5.8 VLCR STR LF (GAUZE/BANDAGES/DRESSINGS) ×2 IMPLANT
BNDG ELASTIC 3X5.8 VLCR STR LF (GAUZE/BANDAGES/DRESSINGS) ×1 IMPLANT
BNDG ELASTIC 4X5.8 VLCR STR LF (GAUZE/BANDAGES/DRESSINGS) ×1 IMPLANT
BNDG ESMARK 4X9 LF (GAUZE/BANDAGES/DRESSINGS) ×2 IMPLANT
BNDG GAUZE ELAST 4 BULKY (GAUZE/BANDAGES/DRESSINGS) ×5 IMPLANT
CHLORAPREP W/TINT 26 (MISCELLANEOUS) ×2 IMPLANT
CORD BIPOLAR FORCEPS 12FT (ELECTRODE) ×2 IMPLANT
COVER BACK TABLE 60X90IN (DRAPES) ×2 IMPLANT
COVER MAYO STAND STRL (DRAPES) ×2 IMPLANT
COVER SURGICAL LIGHT HANDLE (MISCELLANEOUS) ×2 IMPLANT
CUFF TOURN SGL QUICK 24 (TOURNIQUET CUFF) ×1
CUFF TRNQT CYL 24X4X16.5-23 (TOURNIQUET CUFF) IMPLANT
DECANTER SPIKE VIAL GLASS SM (MISCELLANEOUS) ×2 IMPLANT
DRAPE HALF SHEET 40X57 (DRAPES) ×2 IMPLANT
DRAPE SURG 17X23 STRL (DRAPES) ×2 IMPLANT
DRSG ADAPTIC 3X8 NADH LF (GAUZE/BANDAGES/DRESSINGS) ×1 IMPLANT
GAUZE SPONGE 4X4 12PLY STRL (GAUZE/BANDAGES/DRESSINGS) ×2 IMPLANT
GAUZE XEROFORM 1X8 LF (GAUZE/BANDAGES/DRESSINGS) ×2 IMPLANT
GLOVE SURG ENC TEXT LTX SZ7 (GLOVE) ×2 IMPLANT
GLOVE SURG UNDER POLY LF SZ7 (GLOVE) ×2 IMPLANT
GOWN STRL REUS W/ TWL LRG LVL3 (GOWN DISPOSABLE) ×2 IMPLANT
GOWN STRL REUS W/ TWL XL LVL3 (GOWN DISPOSABLE) ×2 IMPLANT
GOWN STRL REUS W/TWL LRG LVL3 (GOWN DISPOSABLE) ×2
GOWN STRL REUS W/TWL XL LVL3 (GOWN DISPOSABLE) ×1
KIT BASIN OR (CUSTOM PROCEDURE TRAY) ×2 IMPLANT
KIT TURNOVER KIT B (KITS) ×2 IMPLANT
NS IRRIG 1000ML POUR BTL (IV SOLUTION) ×2 IMPLANT
PACK ORTHO EXTREMITY (CUSTOM PROCEDURE TRAY) ×2 IMPLANT
PAD ABD 8X10 STRL (GAUZE/BANDAGES/DRESSINGS) ×1 IMPLANT
PAD ARMBOARD 7.5X6 YLW CONV (MISCELLANEOUS) ×2 IMPLANT
PAD CAST 4YDX4 CTTN HI CHSV (CAST SUPPLIES) ×2 IMPLANT
PADDING CAST ABS 3INX4YD NS (CAST SUPPLIES)
PADDING CAST ABS COTTON 3X4 (CAST SUPPLIES) ×1 IMPLANT
PADDING CAST COTTON 4X4 STRL (CAST SUPPLIES) ×2
SET CYSTO W/LG BORE CLAMP LF (SET/KITS/TRAYS/PACK) ×2 IMPLANT
SPLINT PLASTER CAST XFAST 3X15 (CAST SUPPLIES) ×1 IMPLANT
SPLINT PLASTER XTRA FASTSET 3X (CAST SUPPLIES)
SPONGE T-LAP 4X18 ~~LOC~~+RFID (SPONGE) ×2 IMPLANT
SUT ETHILON 4 0 PS 2 18 (SUTURE) ×4 IMPLANT
SUT FIBERWIRE 3-0 18 TAPR NDL (SUTURE)
SUT MNCRL AB 4-0 PS2 18 (SUTURE) ×1 IMPLANT
SUT VIC AB 2-0 SH 27 (SUTURE) ×1
SUT VIC AB 2-0 SH 27XBRD (SUTURE) IMPLANT
SUTURE FIBERWR 3-0 18 TAPR NDL (SUTURE) ×1 IMPLANT
SYR BULB EAR ULCER 3OZ GRN STR (SYRINGE) ×2 IMPLANT
SYR CONTROL 10ML LL (SYRINGE) IMPLANT
TOWEL GREEN STERILE (TOWEL DISPOSABLE) ×2 IMPLANT
TOWEL GREEN STERILE FF (TOWEL DISPOSABLE) ×4 IMPLANT
TUBE CONNECTING 12X1/4 (SUCTIONS) ×2 IMPLANT
UNDERPAD 30X36 HEAVY ABSORB (UNDERPADS AND DIAPERS) ×2 IMPLANT
WATER STERILE IRR 1000ML POUR (IV SOLUTION) ×2 IMPLANT
YANKAUER SUCT BULB TIP NO VENT (SUCTIONS) ×2 IMPLANT

## 2021-09-13 NOTE — Discharge Instructions (Signed)
° ° °Jerrid Forgette, M.D. °Hand Surgery ° °POST-OPERATIVE DISCHARGE INSTRUCTIONS ° ° °PRESCRIPTIONS: °You have been given a prescription to be taken as directed for post-operative pain control.  You may also take over the counter ibuprofen/aleve and tylenol for pain. Take this as directed on the packaging. Do not exceed 3000 mg tylenol/acetaminophen in 24 hours. ° °Ibuprofen 600-800 mg (3-4) tablets by mouth every 6 hours as needed for pain.  °OR °Aleve 2 tablets by mouth every 12 hours (twice daily) as needed for pain.  °AND/OR °Tylenol 1000 mg (2 tablets) every 8 hours as needed for pain. ° °Please use your pain medication carefully, as refills are limited and you may not be provided with one.  As stated above, please use over the counter pain medicine - it will also be helpful with decreasing your swelling.  ° ° °ANESTHESIA: °After your surgery, post-surgical discomfort or pain is likely. This discomfort can last several days to a few weeks. At certain times of the day your discomfort may be more intense.  ° °Did you receive a nerve block?  °A nerve block can provide pain relief for one hour to two days after your surgery. As long as the nerve block is working, you will experience little or no sensation in the area the surgeon operated on.  °As the nerve block wears off, you will begin to experience pain or discomfort. It is very important that you begin taking your prescribed pain medication before the nerve block fully wears off. Treating your pain at the first sign of the block wearing off will ensure your pain is better controlled and more tolerable when full-sensation returns. Do not wait until the pain is intolerable, as the medicine will be less effective. It is better to treat pain in advance than to try and catch up.  ° °General Anesthesia:  °If you did not receive a nerve block during your surgery, you will need to start taking your pain medication shortly after your surgery and should continue to do  so as prescribed by your surgeon.   ° ° °ICE AND ELEVATION: °You may use ice for the first 48-72 hours, but it is not critical.   °Motion of your fingers is very important s to decrease the swelling.  °Elevation, as much as possible for the next 48 hours, is critical for decreasing swelling as well as for pain relief. Elevation means when you are seated or lying down, you hand should be at or above your heart. When walking, the hand needs to be at or above the level of your elbow.  °If the bandage gets too tight, it may need to be loosened. Please contact our office and we will instruct you in how to do this.  ° ° °SURGICAL BANDAGES:  °Keep your dressing and/or splint clean and dry at all times.  Do not remove until you are seen again in the office.  If careful, you may place a plastic bag over your bandage and tape the end to shower, but be careful, do not get your bandages wet.  °  ° °HAND THERAPY:  °You may not need any. If you do, we will begin this at your follow up visit in the clinic.  ° ° °ACTIVITY AND WORK: °You are encouraged to move any fingers which are not in the bandage.  °Light use of the fingers is allowed to assist the other hand with daily hygiene and eating, but strong gripping or lifting is often uncomfortable and   should be avoided.  °You might miss a variable period of time from work and hopefully this issue has been discussed prior to surgery. You may not do any heavy work with your affected hand for about 2 weeks.  ° ° °[facility] °[address] °[place],  [place]  [address] °[phone]  °

## 2021-09-13 NOTE — Op Note (Signed)
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  Risks, benefits, and alternatives to surgery were again discussed with the patient wishing to proceed with surgery.  Informed consent was signed after our discussion.   PREOPERATIVE DIAGNOSIS:  Right lateral epicondylitis  POSTOPERATIVE DIAGNOSIS: Same.  PROCEDURE:  Right lateral epicondyle debridement Tenotomy of right ECRB tendon   SURGEON: Evan Nunez, M.D.  ASSIST:   ANESTHESIA:  Regional, MAC  IV FLUIDS AND URINE: See anesthesia.  ESTIMATED BLOOD LOSS: 10 mL.  IMPLANTS: * No implants in log *   DRAINS: No  COMPLICATIONS: see description of procedure.  DESCRIPTION OF PROCEDURE: The patient was met in the preoperative holding area where the surgical site was marked and the consent form was verified.  The patient was then taken to the operating room and transferred to the operating table.  All bony prominences were well padded.  A tourniquet was applied to the right upper arm.  The operative extremity was prepped and draped in the usual and sterile fashion.  A formal time-out was performed to confirm that this was the correct patient, surgery, side, and site.   Following timeout, the limb was exsanguinated with an esmarch bandage and the tourniquet inflated to 250 mmHg.  A longitudinal incision was made just anterior to the middle epicondyle and extending distally over the radiocapitellar joint.  The subcutaneous tissue was divided and any crossing vessels were coagulated.  The fascia overlying the ECRL and EDC tendons was identified.  The interval between these two muscle origins was incised and the ECRL tendon was elevated off of the underlying ECRB tendon.  The ECRB tendon had a grey, mucoid appearance.  The diseased tendon was sharply excised.  Any portion of tendon that was friable  and easily scraped away with a blade was also excised.  The ECRB origin on the lateral epicondyle was debrided with a rongeur.  Satisfied with the debridement, the wound was thoroughly irrigated.  The ECRL and EDC interval was closed using a running 2-0 vicryl suture.  The remainder of the wound was closed with 4-0 monocryl in a buried fashion followed by 4-0 nylon sutures in horizontal mattress fashion.  The wound was dressed with xeroform, folded kerlix, cast padding, and an ace wrap.  The drapes were taken down and the patient was reversed from anesthesia.  All counts were correct.  He was then taken to the PACU in stable condition.    POSTOPERATIVE PLAN: He will be discharged to home with appropriate pain medication and discharge instructions.  I will see him back in 10-14 days for his first postop visit.   Evan Nine, MD 3:42 PM

## 2021-09-13 NOTE — Anesthesia Postprocedure Evaluation (Signed)
Anesthesia Post Note  Patient: Evan Nunez  Procedure(s) Performed: Right lateral epicondyle DEBRIDEMENT ELBOW (Right: Elbow)     Patient location during evaluation: PACU Anesthesia Type: Regional and MAC Level of consciousness: awake and alert and oriented Pain management: pain level controlled Vital Signs Assessment: post-procedure vital signs reviewed and stable Respiratory status: nonlabored ventilation, respiratory function stable and spontaneous breathing Cardiovascular status: stable and blood pressure returned to baseline Postop Assessment: no apparent nausea or vomiting Anesthetic complications: no   No notable events documented.  Last Vitals:  Vitals:   09/13/21 1330 09/13/21 1539  BP: (!) 153/74 138/87  Pulse: 67 84  Resp: 14 12  Temp:  36.6 C  SpO2: 98% 97%    Last Pain:  Vitals:   09/13/21 1539  TempSrc:   PainSc: 0-No pain   Pain Goal: Patients Stated Pain Goal: 0 (09/13/21 1154)                 Carneshia Raker A.

## 2021-09-13 NOTE — Transfer of Care (Signed)
Immediate Anesthesia Transfer of Care Note  Patient: Gomer France  Procedure(s) Performed: Right lateral epicondyle DEBRIDEMENT ELBOW (Right: Elbow)  Patient Location: PACU  Anesthesia Type:MAC  Level of Consciousness: drowsy and patient cooperative  Airway & Oxygen Therapy: Patient Spontanous Breathing  Post-op Assessment: Report given to RN and Post -op Vital signs reviewed and stable  Post vital signs: Reviewed and stable  Last Vitals:  Vitals Value Taken Time  BP    Temp    Pulse 84 09/13/21 1536  Resp 17 09/13/21 1538  SpO2 97 % 09/13/21 1536  Vitals shown include unvalidated device data.  Last Pain:  Vitals:   09/13/21 1154  TempSrc:   PainSc: 1       Patients Stated Pain Goal: 0 (36/43/83 7793)  Complications: No notable events documented.

## 2021-09-13 NOTE — Anesthesia Preprocedure Evaluation (Signed)
Anesthesia Evaluation  Patient identified by MRN, date of birth, ID band Patient awake    Reviewed: Allergy & Precautions, NPO status , Patient's Chart, lab work & pertinent test results  Airway Mallampati: II  TM Distance: >3 FB Neck ROM: Full    Dental no notable dental hx. (+) Teeth Intact, Dental Advisory Given   Pulmonary former smoker,    Pulmonary exam normal breath sounds clear to auscultation       Cardiovascular negative cardio ROS Normal cardiovascular exam Rhythm:Regular Rate:Normal     Neuro/Psych negative neurological ROS  negative psych ROS   GI/Hepatic negative GI ROS, Neg liver ROS,   Endo/Other  Obesity  Renal/GU Hx/o renal calculi  negative genitourinary   Musculoskeletal  (+) Arthritis , Right lateral epicondylitis   Abdominal (+) + obese,   Peds  Hematology negative hematology ROS (+)   Anesthesia Other Findings   Reproductive/Obstetrics                             Anesthesia Physical Anesthesia Plan  ASA: 2  Anesthesia Plan: MAC and Regional   Post-op Pain Management:    Induction: Intravenous  PONV Risk Score and Plan: 2 and Treatment may vary due to age or medical condition, Propofol infusion, Ondansetron and Midazolam  Airway Management Planned: Natural Airway and Simple Face Mask  Additional Equipment:   Intra-op Plan:   Post-operative Plan:   Informed Consent: I have reviewed the patients History and Physical, chart, labs and discussed the procedure including the risks, benefits and alternatives for the proposed anesthesia with the patient or authorized representative who has indicated his/her understanding and acceptance.     Dental advisory given  Plan Discussed with: CRNA and Anesthesiologist  Anesthesia Plan Comments:         Anesthesia Quick Evaluation

## 2021-09-13 NOTE — Anesthesia Procedure Notes (Signed)
Anesthesia Regional Block: Supraclavicular block   Pre-Anesthetic Checklist: , timeout performed,  Correct Patient, Correct Site, Correct Laterality,  Correct Procedure, Correct Position, site marked,  Risks and benefits discussed,  Surgical consent,  Pre-op evaluation,  At surgeon's request  Laterality: Right  Prep: chloraprep       Needles:  Injection technique: Single-shot  Needle Type: Echogenic Stimulator Needle     Needle Length: 10cm  Needle Gauge: 21   Needle insertion depth: 6 cm   Additional Needles:   Procedures:,,,, ultrasound used (permanent image in chart),,    Narrative:  Start time: 09/13/2021 1:20 PM End time: 09/13/2021 1:25 PM Injection made incrementally with aspirations every 5 mL.  Performed by: Personally  Anesthesiologist: Mal Amabile, MD  Additional Notes: Timeout performed. Patient sedated. Relevant anatomy ID'd using [place]. Incremental 2-50ml injection of LA with frequent aspiration. Patient tolerated procedure well.    Right Supraclavicular Block

## 2021-09-13 NOTE — Brief Op Note (Signed)
09/13/2021  3:33 PM  PATIENT:  Evan Nunez  46 y.o. male  PRE-OPERATIVE DIAGNOSIS:  Right lateral epicondylitis  POST-OPERATIVE DIAGNOSIS:  Right lateral epicondylitis  PROCEDURE:  Procedure(s): Right lateral epicondyle DEBRIDEMENT ELBOW (Right)  SURGEON:  Surgeon(s) and Role:    * Sherilyn Cooter, MD - Primary  PHYSICIAN ASSISTANT:   ASSISTANTS: none   ANESTHESIA:   regional and MAC  EBL:  10 cc   BLOOD ADMINISTERED:none  DRAINS: none   LOCAL MEDICATIONS USED:  NONE  SPECIMEN:  No Specimen  DISPOSITION OF SPECIMEN:  N/A  COUNTS:  YES  TOURNIQUET:   Total Tourniquet Time Documented: Upper Arm (Right) - 28 minutes Total: Upper Arm (Right) - 28 minutes   DICTATION: .Viviann Spare Dictation  PLAN OF CARE: Discharge to home after PACU  PATIENT DISPOSITION:  PACU - hemodynamically stable.   Delay start of Pharmacological VTE agent (>24hrs) due to surgical blood loss or risk of bleeding: not applicable

## 2021-09-13 NOTE — Interval H&P Note (Signed)
History and Physical Interval Note:  09/13/2021 2:12 PM  Evan Nunez  has presented today for surgery, with the diagnosis of Right lateral epicondylitis.  The various methods of treatment have been discussed with the patient and family. After consideration of risks, benefits and other options for treatment, the patient has consented to  Procedure(s): Right lateral epicondyle DEBRIDEMENT ELBOW (Right) as a surgical intervention.  The patient's history has been reviewed, patient examined, no change in status, stable for surgery.  I have reviewed the patient's chart and labs.  Questions were answered to the patient's satisfaction.     Evan Nunez

## 2021-09-14 ENCOUNTER — Encounter (HOSPITAL_COMMUNITY): Payer: Self-pay | Admitting: Orthopedic Surgery

## 2021-09-18 ENCOUNTER — Telehealth: Payer: Self-pay

## 2021-09-18 NOTE — Telephone Encounter (Signed)
Patient called into the office stating that he is feeling dizzy and wanted to know if this is normal after surgery. He stated that he has stopped taking his medication Friday..  please advise

## 2021-09-25 ENCOUNTER — Ambulatory Visit (INDEPENDENT_AMBULATORY_CARE_PROVIDER_SITE_OTHER): Payer: Self-pay | Admitting: Orthopedic Surgery

## 2021-09-25 ENCOUNTER — Encounter: Payer: Self-pay | Admitting: Orthopedic Surgery

## 2021-09-25 ENCOUNTER — Other Ambulatory Visit: Payer: Self-pay

## 2021-09-25 VITALS — Ht 63.0 in | Wt 170.0 lb

## 2021-09-25 DIAGNOSIS — M7711 Lateral epicondylitis, right elbow: Secondary | ICD-10-CM

## 2021-09-25 NOTE — Progress Notes (Signed)
° °  Post-Op Visit Note   Patient: Evan Nunez           Date of Birth: [birth year]           MRN: [id number] Visit Date: 09/25/2021 PCP: Patient, No Pcp Per (Inactive)   Assessment & Plan:  Chief Complaint:  Chief Complaint  Patient presents with   Right Elbow - Routine Post Op    09/13/2021 right lateral epicondyle debridement, tenotomy of right ECRB tendon   Visit Diagnoses:  1. Lateral epicondylitis of right elbow     Plan: Patient is approximately two weeks s/p right lateral epicondylitis debridement.  He is doing well.  He has mild soreness in the elbow.  The incision is clean and dry.  The sutures were removed.  He has full flexion but lacks some extension secondary to soreness and stiffness.  We will start him in therapy to start working on ROM.  I can see him back in another month.   Follow-Up Instructions: No follow-ups on file.   Orders:  No orders of the defined types were placed in this encounter.  No orders of the defined types were placed in this encounter.   Imaging: No results found.  PMFS History: Patient Active Problem List   Diagnosis Date Noted   Lateral epicondylitis of right elbow 08/21/2021   Peroneal tendon injury 03/16/2020   Past Medical History:  Diagnosis Date   History of kidney stones    Lateral epicondylitis of right elbow 08/21/2021    Family History  Problem Relation Age of Onset   Healthy Mother     Past Surgical History:  Procedure Laterality Date   IRRIGATION AND DEBRIDEMENT ELBOW Right 09/13/2021   Procedure: Right lateral epicondyle DEBRIDEMENT ELBOW;  Surgeon: Marlyne Beards, MD;  Location: [facility] OR;  Service: Orthopedics;  Laterality: Right;   Social History   Occupational History   Not on file  Tobacco Use   Smoking status: Former    Years: 5.00    Types: Cigarettes    Quit date: 01/14/2020    Years since quitting: 1.6   Smokeless tobacco: Never  Vaping Use   Vaping Use: Not on file  Substance and Sexual Activity    Alcohol use: Yes    Alcohol/week: 12.0 standard drinks    Types: 12 Cans of beer per week   Drug use: No   Sexual activity: Not on file

## 2021-10-02 ENCOUNTER — Telehealth: Payer: Self-pay | Admitting: Orthopedic Surgery

## 2021-10-02 NOTE — Telephone Encounter (Signed)
Patient called. He would like a referral for OT.  

## 2021-10-10 ENCOUNTER — Telehealth: Payer: Self-pay | Admitting: Orthopedic Surgery

## 2021-10-10 ENCOUNTER — Other Ambulatory Visit: Payer: Self-pay | Admitting: Orthopedic Surgery

## 2021-10-10 DIAGNOSIS — M7711 Lateral epicondylitis, right elbow: Secondary | ICD-10-CM

## 2021-10-10 NOTE — Telephone Encounter (Signed)
Patient is aware OT has been placed ?

## 2021-10-10 NOTE — Telephone Encounter (Signed)
Patient called. He would like a referral for OT, his call back number is [phone]. He would like a call back from the nurse.  ?

## 2021-10-12 ENCOUNTER — Other Ambulatory Visit: Payer: Self-pay

## 2021-10-12 ENCOUNTER — Ambulatory Visit (INDEPENDENT_AMBULATORY_CARE_PROVIDER_SITE_OTHER): Payer: Self-pay | Admitting: Orthopedic Surgery

## 2021-10-12 ENCOUNTER — Encounter: Payer: Self-pay | Admitting: Rehabilitative and Restorative Service Providers"

## 2021-10-12 ENCOUNTER — Ambulatory Visit (INDEPENDENT_AMBULATORY_CARE_PROVIDER_SITE_OTHER): Payer: Self-pay | Admitting: Rehabilitative and Restorative Service Providers"

## 2021-10-12 DIAGNOSIS — M7711 Lateral epicondylitis, right elbow: Secondary | ICD-10-CM

## 2021-10-12 DIAGNOSIS — M6281 Muscle weakness (generalized): Secondary | ICD-10-CM

## 2021-10-12 DIAGNOSIS — M25621 Stiffness of right elbow, not elsewhere classified: Secondary | ICD-10-CM

## 2021-10-12 DIAGNOSIS — M25521 Pain in right elbow: Secondary | ICD-10-CM

## 2021-10-12 NOTE — Therapy (Signed)
?OUTPATIENT OCCUPATIONAL THERAPY ORTHO EVALUATION ? ?Patient Name: Evan Nunez ?MRN: [id number] ?DOB:08-Mar-1976, 46 y.o., male ?Today's Date: 10/12/2021 ? ?PCP: Patient, No Pcp Per (Inactive) ?REFERRING PROVIDER: Marlyne Beards, MD ? ? OT End of Session - 10/12/21 1152   ? ? Visit Number 1   ? Number of Visits 8   ? Date for OT Re-Evaluation 11/24/21   ? Authorization Type self-pay   ? OT Start Time 1152   ? OT Stop Time 1243   ? OT Time Calculation (min) 51 min   ? Activity Tolerance Patient tolerated treatment well;No increased pain   ? Behavior During Therapy [facility] for tasks assessed/performed   ? ?  ?  ? ?  ? ? ?Past Medical History:  ?Diagnosis Date  ? History of kidney stones   ? Lateral epicondylitis of right elbow 08/21/2021  ? ?Past Surgical History:  ?Procedure Laterality Date  ? IRRIGATION AND DEBRIDEMENT ELBOW Right 09/13/2021  ? Procedure: Right lateral epicondyle DEBRIDEMENT ELBOW;  Surgeon: Marlyne Beards, MD;  Location: [facility] OR;  Service: Orthopedics;  Laterality: Right;  ? ?Patient Active Problem List  ? Diagnosis Date Noted  ? Lateral epicondylitis of right elbow 08/21/2021  ? Peroneal tendon injury 03/16/2020  ? ? ?ONSET DATE: 09/13/21 DOS ? ?REFERRING DIAG: M77.11 (ICD-10-CM) - Lateral epicondylitis of right elbow ? ?THERAPY DIAG:  ?Pain in right elbow ? ?Stiffness of right elbow, not elsewhere classified ? ?Muscle weakness (generalized) ? ?SUBJECTIVE:  ? 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 He asks many good questions, seems nervous to "push" at elbow or return to work.  ? ?PERTINENT HISTORY: Per MD: "Lateral epicondylitis s/p extensor tendon debridement.  Wants to work on regaining full ROM." ? ?PRECAUTIONS: None ? ?WEIGHT BEARING RESTRICTIONS No ? ?PAIN:  ?Are you having pain? Yes: NPRS  scale: 2/10 rt lateral elbow at rest, up to 4/10 in past week.  ? ?FALLS: Has patient fallen in last 6 months? No, Number of falls: 0 ? ?LIVING ENVIRONMENT: ?Lives with: lives with their family ?Lives in: House/apartment ? ?PLOF: Independent ? ?PATIENT GOALS "Get arm better to return to work"  ? ?OBJECTIVE:  ? ?HAND DOMINANCE: Right ? ?ADLs: ?Overall ADLs: He has been avoiding lifting and carrying objects of significant weight, difficulty reaching behind him, home care activities limited for now.  ? ?FUNCTIONAL OUTCOME MEASURES: ?Quick Dash: TBD as needed ? ?UE ROM    ? ?Active ROM Right ?10/12/2021  ?Elbow flexion 142  ?Elbow extension -21  ?Wrist flexion 58  ?Wrist extension 50  ?Wrist ulnar deviation   ?Wrist radial deviation   ?Wrist pronation 88  ?Wrist supination 62  ?(Blank rows = not tested) ? ? ?UE MMT:    ? ?MMT Right ?10/12/2021  ?Elbow flexion 4+/5  ?Elbow extension 4-/5  ?Wrist flexion 4+/5  ?Wrist extension 4-/5   ?(Blank rows = not tested) ? ?HAND FUNCTION: ?Grip strength: Right: TBD lbs; Left: TBD lbs ? ?COORDINATION: ?observed lack of gross motor coordination at elbow with reaching tasks ? ?SENSATION: ?WFL ? ?EDEMA: some swelling right around elbow sx site, none significant in wrist/hand ? ?COGNITION: ?Overall cognitive status: Within functional limits for tasks assessed ? ?OBSERVATIONS: He tends to guard his right arm, avoid gripping, shaking hands, etc.  ? ? ?TODAY'S TREATMENT:  ?  10/12/21 Eval: OT edu on importance of not guarding from pain in rt arm and using arm for ADLs that are light weight. OT also advises palm up position for now to take pressure off lat epi and that bicep flexion is fine, but to use caution with elbow extension and wrist extension still. He states understanding. OT also provides comprehensive HEP and has pt demo back the following with no pain or problems:  ? ?Exercises ?Bicep Stretch at Table - 4-6 x daily - 1 sets - 10-15 reps ?Seated Elbow PROM Blocked Extension - 4-6 x  daily - 1 sets - 10-15 reps ?Elbow Flexion PROM - 4-6 x daily - 1 sets - 10-15 reps ?Wrist Prayer Stretch - 2-3 x daily - 3-5 reps - 15 sec hold ?Seated Wrist Flexion Stretch - 3-6 x daily - 3-5 reps - 15 hold ?Seated Wrist Supination Stretch - 4-6 x daily - 1 sets - 10-15 reps ?Wrist Pronation Stretch - 4-6 x daily - 1 sets - 10-15 reps ? ? ?PATIENT EDUCATION: ?Education details: See tx section above for details  ?Person educated: Patient ?Education method: Explanation, Demonstration, and Handouts ?Education comprehension: verbalized understanding, returned demonstration, and needs further education ? ? ?HOME EXERCISE PROGRAM: ?Access Code: [email or web address] ?URL: [email or web address] ?Date: 10/12/2021 ?Prepared by: Fannie Knee ? ?SSESSMENT: ? ?CLINICAL IMPRESSION: ?Patient is a 46 y.o. male who was seen today for occupational therapy evaluation for right elbow pain and dysfunction with lateral epicondylitis s/p debridement.  ? ?PERFORMANCE DEFICITS in functional skills including IADLs, coordination, sensation, edema, ROM, strength, pain, fascial restrictions, flexibility, GMC, body mechanics, endurance, and UE functional use, cognitive skills including problem solving and safety awareness, and psychosocial skills including coping strategies, habits, and routines and behaviors.  ? ?IMPAIRMENTS are limiting patient from IADLs, rest and sleep, work, play, and leisure.  ? ?COMORBIDITIES may have co-morbidities  that affects occupational performance. Patient will benefit from skilled OT to address above impairments and improve overall function. ? ?MODIFICATION OR ASSISTANCE TO COMPLETE EVALUATION: No modification of tasks or assist necessary to complete an evaluation. ? ?OT OCCUPATIONAL PROFILE AND HISTORY: Problem focused assessment: Including review of records relating to presenting problem. ? ?CLINICAL DECISION MAKING: LOW - limited treatment options, no task modification necessary ? ?REHAB POTENTIAL:  Good ? ?EVALUATION COMPLEXITY: Low ? ? ? ? ?GOALS: ?Goals reviewed with patient? Yes ? ?SHORT TERM GOALS: (STG required if POC>30 days) ? ?Pt will demo/state understanding of initial HEP to improve pain levels and prerequisite motion. ?Target date: 10/20/21 ?Goal status: INITIAL ? ? ?LONG TERM GOALS: ? ?Pt will improve grip strength in right hand from TBDlbs to at least 80lbs for functional use at home and in IADLs. ?Target date: 11/24/21 ?Goal status: INITIAL ? ?3.  Pt will improve A/ROM in rt elbow ext from (-21*) to at least (-10*), to have functional motion for tasks like reach and grasp.  ?Target date: 11/24/21 ?Goal status: INITIAL ? ?4.  Pt will improve strength in right arm tricep from 4-/5 MMT to at least 4+/5 MMT to have increased functional ability to carry out selfcare and higher-level homecare tasks with no difficulty. ?Target date: 11/24/21 ?Goal status: INITIAL ? ?5.  Pt will decrease pain at worst from 4/10 to 1/10 or better to have better sleep and occupational participation in daily roles. ?Target date: 11/24/21 ?Goal status: INITIAL ? ? ?PLAN: ?OT FREQUENCY: 1-2x/week ? ?OT DURATION: 6 weeks ? ?PLANNED INTERVENTIONS: self care/ADL training, therapeutic exercise, therapeutic activity, neuromuscular re-education, manual therapy,  scar mobilization, passive range of motion, splinting, ultrasound, moist heat, cryotherapy, patient/family education, and coping strategies training ? ?RECOMMENDED OTHER SERVICES: none now  ? ?CONSULTED AND AGREED WITH PLAN OF CARE: Patient ? ?PLAN FOR NEXT SESSION: Review HEP and check A/ROM progress at elbow, try hand strength, grip, and give light PRE at wrist and hand ? ? ?Fannie KneeNathanael Arletha Marschke, OTR/L, CHT ?10/12/2021, 4:39 PM ?  ?

## 2021-10-12 NOTE — Progress Notes (Signed)
? ?  Post-Op Visit Note ?  ?Patient: Evan Nunez           ?Date of Birth: Dec 03, 1975           ?MRN: [id number] ?Visit Date: 10/12/2021 ?PCP: Patient, No Pcp Per (Inactive) ? ? ?Assessment & Plan: ? ?Chief Complaint:  ?Chief Complaint  ?Patient presents with  ? Right Elbow - Follow-up  ?  Doing much better but still very sensative  ? ?Visit Diagnoses:  ?1. Lateral epicondylitis of right elbow   ? ? ?Plan: Patient is now just under a month out from ECRB debridement of right lateral epicondylitis.  He is doing well postoperatively.  He has near full ROM of the elbow but lacks 5-10 degrees of extension.  He is able to use his hand and wrist without pain.  He is overall happy with his progress.  He seeing therapy today to work on strengthening and ROM.  I can see him back in another two weeks to see how he's progressing.  ? ?Follow-Up Instructions: No follow-ups on file.  ? ?Orders:  ?No orders of the defined types were placed in this encounter. ? ?No orders of the defined types were placed in this encounter. ? ? ?Imaging: ?No results found. ? ?PMFS History: ?Patient Active Problem List  ? Diagnosis Date Noted  ? Lateral epicondylitis of right elbow 08/21/2021  ? Peroneal tendon injury 03/16/2020  ? ?Past Medical History:  ?Diagnosis Date  ? History of kidney stones   ? Lateral epicondylitis of right elbow 08/21/2021  ?  ?Family History  ?Problem Relation Age of Onset  ? Healthy Mother   ?  ?Past Surgical History:  ?Procedure Laterality Date  ? IRRIGATION AND DEBRIDEMENT ELBOW Right 09/13/2021  ? Procedure: Right lateral epicondyle DEBRIDEMENT ELBOW;  Surgeon: Marlyne Beards, MD;  Location: [facility];  Service: Orthopedics;  Laterality: Right;  ? ?Social History  ? ?Occupational History  ? Not on file  ?Tobacco Use  ? Smoking status: Former  ?  Years: 5.00  ?  Types: Cigarettes  ?  Quit date: 01/14/2020  ?  Years since quitting: 1.7  ? Smokeless tobacco: Never  ?Vaping Use  ? Vaping Use: Not on file  ?Substance and Sexual  Activity  ? Alcohol use: Yes  ?  Alcohol/week: 12.0 standard drinks  ?  Types: 12 Cans of beer per week  ? Drug use: No  ? Sexual activity: Not on file  ? ? ? ?

## 2021-10-18 ENCOUNTER — Ambulatory Visit (INDEPENDENT_AMBULATORY_CARE_PROVIDER_SITE_OTHER): Payer: Self-pay | Admitting: Rehabilitative and Restorative Service Providers"

## 2021-10-18 ENCOUNTER — Other Ambulatory Visit: Payer: Self-pay

## 2021-10-18 DIAGNOSIS — M25521 Pain in right elbow: Secondary | ICD-10-CM

## 2021-10-18 DIAGNOSIS — M25621 Stiffness of right elbow, not elsewhere classified: Secondary | ICD-10-CM

## 2021-10-18 DIAGNOSIS — M6281 Muscle weakness (generalized): Secondary | ICD-10-CM

## 2021-10-18 NOTE — Therapy (Addendum)
OUTPATIENT OCCUPATIONAL THERAPY TREATMENT & DISCHARGE NOTE   Patient Name: Evan Nunez MRN: [id number] DOB:12-09-1975, 46 y.o., male Today's Date: 12/18/2021  PCP: Patient, No Pcp Per (Inactive) REFERRING PROVIDER: Sherilyn Cooter, MD     Past Medical History:  Diagnosis Date   History of kidney stones    Lateral epicondylitis of right elbow 08/21/2021   Past Surgical History:  Procedure Laterality Date   IRRIGATION AND DEBRIDEMENT ELBOW Right 09/13/2021   Procedure: Right lateral epicondyle DEBRIDEMENT ELBOW;  Surgeon: Sherilyn Cooter, MD;  Location: [place];  Service: Orthopedics;  Laterality: Right;   Patient Active Problem List   Diagnosis Date Noted   Lateral epicondylitis of right elbow 08/21/2021   Peroneal tendon injury 03/16/2020    ONSET DATE: 09/13/21 DOS   REFERRING DIAG: M77.11 (ICD-10-CM) - Lateral epicondylitis of right elbow  THERAPY DIAG:  Pain in right elbow  Stiffness of right elbow, not elsewhere classified  Muscle weakness (generalized)   PERTINENT HISTORY: Per MD: "Lateral epicondylitis s/p extensor tendon debridement.  Wants to work on regaining full ROM."  PRECAUTIONS: none- 5 weeks out now  SUBJECTIVE: He arrives late today. He states HEP initially made him very sore, but he used ice as asked, modified frequency, and now not in any pain during HEP, just tenderness at times. He states desire to go back to work light duty.   PAIN:  Are you having pain? Not now   OBJECTIVE: (All objective assessments below are from initial evaluation on: 10/12/21 unless otherwise specified.)     HAND DOMINANCE: Right   ADLs: Overall ADLs: He has been avoiding lifting and carrying objects of significant weight, difficulty reaching behind him, home care activities limited for now.    FUNCTIONAL OUTCOME MEASURES: Quick Dash: TBD as needed   UE ROM      Active ROM Right 10/12/2021 Right  10/18/21  Elbow flexion 142 145 (154* Lt)  Elbow extension  -21 -11  Wrist flexion 58 59 (70* Lt)   Wrist extension 50 53 (64* Lt)  Wrist ulnar deviation     Wrist radial deviation     Wrist pronation 88   Wrist supination 62   (Blank rows = not tested)     UE MMT:      MMT Right 10/12/2021  Elbow flexion 4+/5  Elbow extension 4-/5  Wrist flexion 4+/5  Wrist extension 4-/5   (Blank rows = not tested)   HAND FUNCTION: 10/18/21 Grip strength: Right: 20 lbs; Left: 58 lbs    COORDINATION: observed lack of gross motor coordination at elbow with reaching tasks   SENSATION: [facility]   EDEMA: some swelling right around elbow sx site, none significant in wrist/hand   OBSERVATIONS: He tends to guard his right arm, avoid gripping, shaking hands, etc.      TODAY'S TREATMENT:  10/18/21: Pt performs AROM and gripping for checks, tolerates gripping well today but weak. AROM has made slight improvements at wrist and FA in 1 week, which is encouraging. Much less tender but lateral epi still TTP somewhat.  OT creates custom soft strap for counter-force brace for right elbow, he states it helps take pressure off elbow with resisted wrist extension. He is edu to wear when doing anything repetitive or stressful to elbow. Again, edu to avoid abducted shoulder and pronated FA postures as able- he states understanding and asks good, relevant questions. OT also upgrades his HEP to light strength at wrist now, 1# wrist ext and 2# wrist flexion. He performs well,  no pain, HEP listed below. Adds new more proximal tricep stretches as well due to tight feeling tricep muscle belly today.   Exercises Bicep Stretch at Table - 4-6 x daily - 1 sets - 10-15 reps Seated Elbow PROM Blocked Extension - 4-6 x daily - 1 sets - 10-15 reps Elbow Flexion PROM - 4-6 x daily - 1 sets - 10-15 reps Tricep Stretch- DO SEATED BY TABLE - 2-4 x daily - 3-5 reps - 15 hold Wrist Prayer Stretch - 2-3 x daily - 3-5 reps - 15 sec hold Seated Wrist Flexion Stretch - 3-6 x daily - 3-5 reps - 15  hold Seated Wrist Supination Stretch - 4-6 x daily - 1 sets - 10-15 reps Wrist Pronation Stretch - 4-6 x daily - 1 sets - 10-15 reps Seated Wrist Flexion with Dumbbell - 2 x daily - 1-2 sets - 5-15 reps Seated Wrist Extension with Dumbbell - 1-2 x daily - 1-2 sets - 5-15 reps Forearm Pronation and Supination with Hammer - 1-2 x daily - 1-2 sets - 5-15 reps     PATIENT EDUCATION: Education details: See tx section above for details  Person educated: Patient Education method: Explanation, Demonstration, and Handouts Education comprehension: verbalized understanding, returned demonstration, and needs further education     HOME EXERCISE PROGRAM: 10/18/21: Access Code: [email or web address] URL: [email or web address] Date: 10/18/2021 Prepared by: Benito Mccreedy   SSESSMENT:   CLINICAL IMPRESSION: 10/18/21: Pt improving and feeling much better, still stiff and still tender, weak for now.    GOALS: Goals reviewed with patient? Yes   SHORT TERM GOALS: (STG required if POC>30 days)   Pt will demo/state understanding of initial HEP to improve pain levels and prerequisite motion. Target date: 10/20/21 Goal status: Met- 10/18/21     LONG TERM GOALS:   Pt will improve grip strength in right hand from 20lbs to at least 50lbs for functional use at home and in IADLs. Target date: 11/24/21 Goal status: INITIAL   3.  Pt will improve A/ROM in rt elbow ext from (-21*) to at least (-10*), to have functional motion for tasks like reach and grasp.  Target date: 11/24/21 Goal status: INITIAL   4.  Pt will improve strength in right arm tricep from 4-/5 MMT to at least 4+/5 MMT to have increased functional ability to carry out selfcare and higher-level homecare tasks with no difficulty. Target date: 11/24/21 Goal status: INITIAL   5.  Pt will decrease pain at worst from 4/10 to 1/10 or better to have better sleep and occupational participation in daily roles. Target date: 11/24/21 Goal status:  INITIAL     PLAN: OT FREQUENCY: d/c now   OT DURATION: d/c now   PLANNED INTERVENTIONS: self care/ADL training, therapeutic exercise, therapeutic activity, neuromuscular re-education, manual therapy, scar mobilization, passive range of motion, splinting, ultrasound, moist heat, cryotherapy, patient/family education, and coping strategies training   RECOMMENDED OTHER SERVICES: none now    CONSULTED AND AGREED WITH PLAN OF CARE: Patient   PLAN FOR NEXT SESSION:  Check HEP and supportive strap.  Assign new hand strength and upgrade PRE to include light weight at elbow as tolerated (supinated to avoid lateral epi stress for now).  Advance at tolerated.     Benito Mccreedy, OTR/L, CHT 12/18/2021, 10:56 AM    OCCUPATIONAL THERAPY DISCHARGE SUMMARY  Visits from Start of Care: 2  Current functional level related to goals / functional outcomes: Pt stopped showing up to outpatient OT, and was contacted, never called  back or returned. Goals could not be addressed/updated. Please see notes for more info/details.   Benito Mccreedy, OTR/L, CHT 12/18/21

## 2021-10-25 ENCOUNTER — Encounter: Payer: Self-pay | Admitting: Rehabilitative and Restorative Service Providers"

## 2021-10-30 ENCOUNTER — Telehealth: Payer: Self-pay | Admitting: Rehabilitative and Restorative Service Providers"

## 2021-10-30 ENCOUNTER — Encounter: Payer: Self-pay | Admitting: Rehabilitative and Restorative Service Providers"

## 2021-10-30 NOTE — Telephone Encounter (Signed)
OT called patient to discuss missed appointment. OT left message with pt to make a new appointment if needed. No contact for 2 weeks from today or future missed appointments could lead to early discharge from therapy. ? ?

## 2021-10-30 NOTE — Therapy (Incomplete)
?OUTPATIENT OCCUPATIONAL THERAPY TREATMENT NOTE ? ? ?Patient Name: Evan Nunez ?MRN: [id number] ?DOB:September 17, 1975, 46 y.o., male ?Today's Date: 10/30/2021 ? ?PCP: Patient, No Pcp Per (Inactive) ?REFERRING PROVIDER: Sherilyn Cooter, MD ? ? ? ? ?Past Medical History:  ?Diagnosis Date  ? History of kidney stones   ? Lateral epicondylitis of right elbow 08/21/2021  ? ?Past Surgical History:  ?Procedure Laterality Date  ? IRRIGATION AND DEBRIDEMENT ELBOW Right 09/13/2021  ? Procedure: Right lateral epicondyle DEBRIDEMENT ELBOW;  Surgeon: Sherilyn Cooter, MD;  Location: [place];  Service: Orthopedics;  Laterality: Right;  ? ?Patient Active Problem List  ? Diagnosis Date Noted  ? Lateral epicondylitis of right elbow 08/21/2021  ? Peroneal tendon injury 03/16/2020  ? ? ?ONSET DATE: 09/13/21 DOS ?  ?REFERRING DIAG: M77.11 (ICD-10-CM) - Lateral epicondylitis of right elbow ? ?THERAPY DIAG:  ?No diagnosis found. ? ? ?PERTINENT HISTORY: Per MD: "Lateral epicondylitis s/p extensor tendon debridement.  Wants to work on regaining full ROM." ? ?PRECAUTIONS: none- 6+ weeks out now ? ?SUBJECTIVE:  ?*** ? ?He arrives late today. He states HEP initially made him very sore, but he used ice as asked, modified frequency, and now not in any pain during HEP, just tenderness at times. He states desire to go back to work light duty.  ? ?PAIN:  ?Are you having pain? *** ? ? ?OBJECTIVE: (All objective assessments below are from initial evaluation on: 10/12/21 unless otherwise specified.)  ? ? ? ?HAND DOMINANCE: Right ?  ?ADLs: ?Overall ADLs: He has been avoiding lifting and carrying objects of significant weight, difficulty reaching behind him, home care activities limited for now.  ?  ?FUNCTIONAL OUTCOME MEASURES: ?Quick Dash: TBD as needed ?  ?UE ROM    ?  ?Active ROM Right ?10/12/2021 Right  ?10/18/21  ?Elbow flexion 142 145 (154* Lt)  ?Elbow extension -21 -11  ?Wrist flexion 58 59 (70* Lt)   ?Wrist extension 50 53 (64* Lt)  ?Wrist ulnar deviation      ?Wrist radial deviation     ?Wrist pronation 88   ?Wrist supination 62   ?(Blank rows = not tested) ?  ?  ?UE MMT:    ?  ?MMT Right ?10/12/2021  ?Elbow flexion 4+/5  ?Elbow extension 4-/5  ?Wrist flexion 4+/5  ?Wrist extension 4-/5   ?(Blank rows = not tested) ?  ?HAND FUNCTION: ?10/18/21 Grip strength: Right: 20 lbs; Left: 58 lbs  ?  ?COORDINATION: ?observed lack of gross motor coordination at elbow with reaching tasks ?  ?SENSATION: ?WFL ?  ?EDEMA: some swelling right around elbow sx site, none significant in wrist/hand ?  ?OBSERVATIONS: He tends to guard his right arm, avoid gripping, shaking hands, etc.  ?  ?  ?TODAY'S TREATMENT:  ?10/30/21:*** ? ?10/18/21: Pt performs AROM and gripping for checks, tolerates gripping well today but weak. AROM has made slight improvements at wrist and FA in 1 week, which is encouraging. Much less tender but lateral epi still TTP somewhat.  OT creates custom soft strap for counter-force brace for right elbow, he states it helps take pressure off elbow with resisted wrist extension. He is edu to wear when doing anything repetitive or stressful to elbow. Again, edu to avoid abducted shoulder and pronated FA postures as able- he states understanding and asks good, relevant questions. OT also upgrades his HEP to light strength at wrist now, 1# wrist ext and 2# wrist flexion. He performs well, no pain, HEP listed below. Adds new more proximal tricep stretches  as well due to tight feeling tricep muscle belly today.  ? ?Exercises ?Bicep Stretch at Table - 4-6 x daily - 1 sets - 10-15 reps ?Seated Elbow PROM Blocked Extension - 4-6 x daily - 1 sets - 10-15 reps ?Elbow Flexion PROM - 4-6 x daily - 1 sets - 10-15 reps ?Tricep Stretch- DO SEATED BY TABLE - 2-4 x daily - 3-5 reps - 15 hold ?Wrist Prayer Stretch - 2-3 x daily - 3-5 reps - 15 sec hold ?Seated Wrist Flexion Stretch - 3-6 x daily - 3-5 reps - 15 hold ?Seated Wrist Supination Stretch - 4-6 x daily - 1 sets - 10-15 reps ?Wrist  Pronation Stretch - 4-6 x daily - 1 sets - 10-15 reps ?Seated Wrist Flexion with Dumbbell - 2 x daily - 1-2 sets - 5-15 reps ?Seated Wrist Extension with Dumbbell - 1-2 x daily - 1-2 sets - 5-15 reps ?Forearm Pronation and Supination with Hammer - 1-2 x daily - 1-2 sets - 5-15 reps ?  ?  ?PATIENT EDUCATION: ?Education details: See tx section above for details  ?Person educated: Patient ?Education method: Explanation, Demonstration, and Handouts ?Education comprehension: verbalized understanding, returned demonstration, and needs further education ?  ?  ?HOME EXERCISE PROGRAM: ?Access Code: [email or web address] ?URL: [email or web address] ?Prepared by: Benito Mccreedy ?  ?SSESSMENT: ?  ?CLINICAL IMPRESSION: ?10/30/21:*** ? ?10/18/21: Pt improving and feeling much better, still stiff and still tender, weak for now.  ?  ?GOALS: ?Goals reviewed with patient? Yes ?  ?SHORT TERM GOALS: (STG required if POC>30 days) ?  ?Pt will demo/state understanding of initial HEP to improve pain levels and prerequisite motion. ?Target date: 10/20/21 ?Goal status: Met- 10/18/21 ?  ?  ?LONG TERM GOALS: ?  ?Pt will improve grip strength in right hand from 20lbs to at least 50lbs for functional use at home and in IADLs. ?Target date: 11/24/21 ?Goal status: INITIAL ?  ?3.  Pt will improve A/ROM in rt elbow ext from (-21*) to at least (-10*), to have functional motion for tasks like reach and grasp.  ?Target date: 11/24/21 ?Goal status: INITIAL ?  ?4.  Pt will improve strength in right arm tricep from 4-/5 MMT to at least 4+/5 MMT to have increased functional ability to carry out selfcare and higher-level homecare tasks with no difficulty. ?Target date: 11/24/21 ?Goal status: INITIAL ?  ?5.  Pt will decrease pain at worst from 4/10 to 1/10 or better to have better sleep and occupational participation in daily roles. ?Target date: 11/24/21 ?Goal status: INITIAL ?  ?  ?PLAN: ?OT FREQUENCY: 1-2x/week ?  ?OT DURATION: 6 weeks ?  ?PLANNED INTERVENTIONS:  self care/ADL training, therapeutic exercise, therapeutic activity, neuromuscular re-education, manual therapy, scar mobilization, passive range of motion, splinting, ultrasound, moist heat, cryotherapy, patient/family education, and coping strategies training ?  ?RECOMMENDED OTHER SERVICES: none now  ?  ?CONSULTED AND AGREED WITH PLAN OF CARE: Patient ?  ?PLAN FOR NEXT SESSION:  ?*** ?Check HEP and supportive strap.  Assign new hand strength and upgrade PRE to include light weight at elbow as tolerated (supinated to avoid lateral epi stress for now).  Advance at tolerated.  ?  ? ?Benito Mccreedy, OTR/L, CHT ?10/30/2021, 7:49 AM ? ?  ? ?  ?

## 2022-09-09 IMAGING — DX DG ELBOW COMPLETE 3+V*R*
4 series · 4 of 4 positions shown · non-contrast
Comparison: None.

CLINICAL DATA: Right elbow pain.

EXAM:
RIGHT ELBOW - COMPLETE 3+ VIEW

[elbow ap]
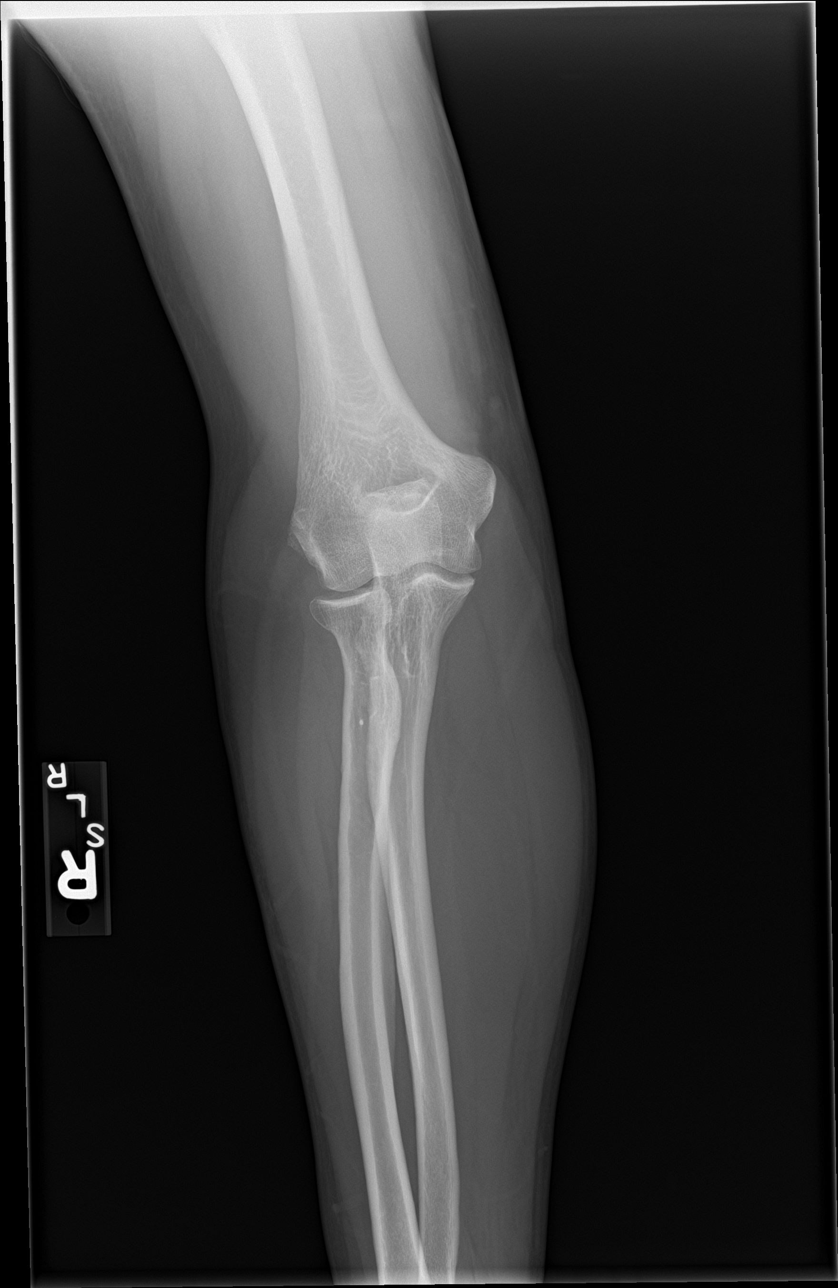

[elbow obl (1 of 2)]
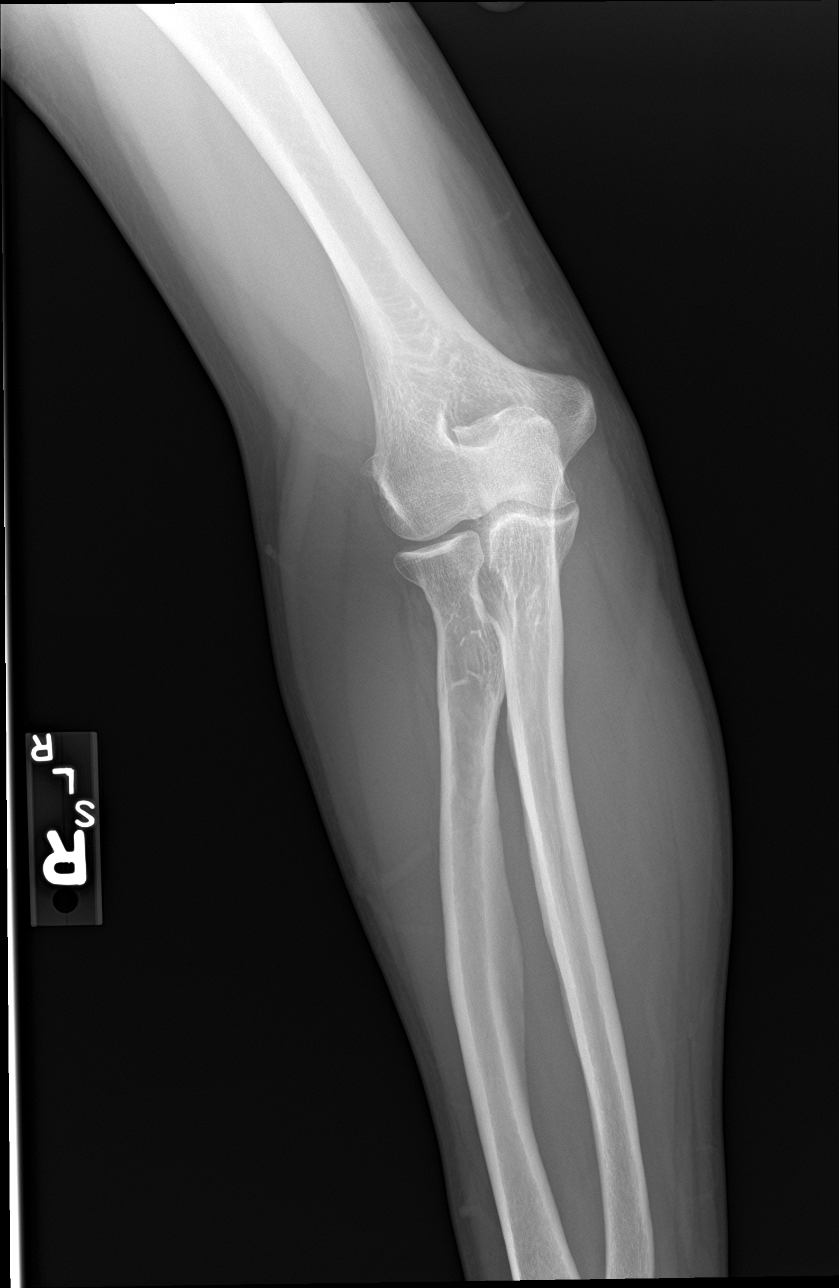

[elbow obl (2 of 2)]
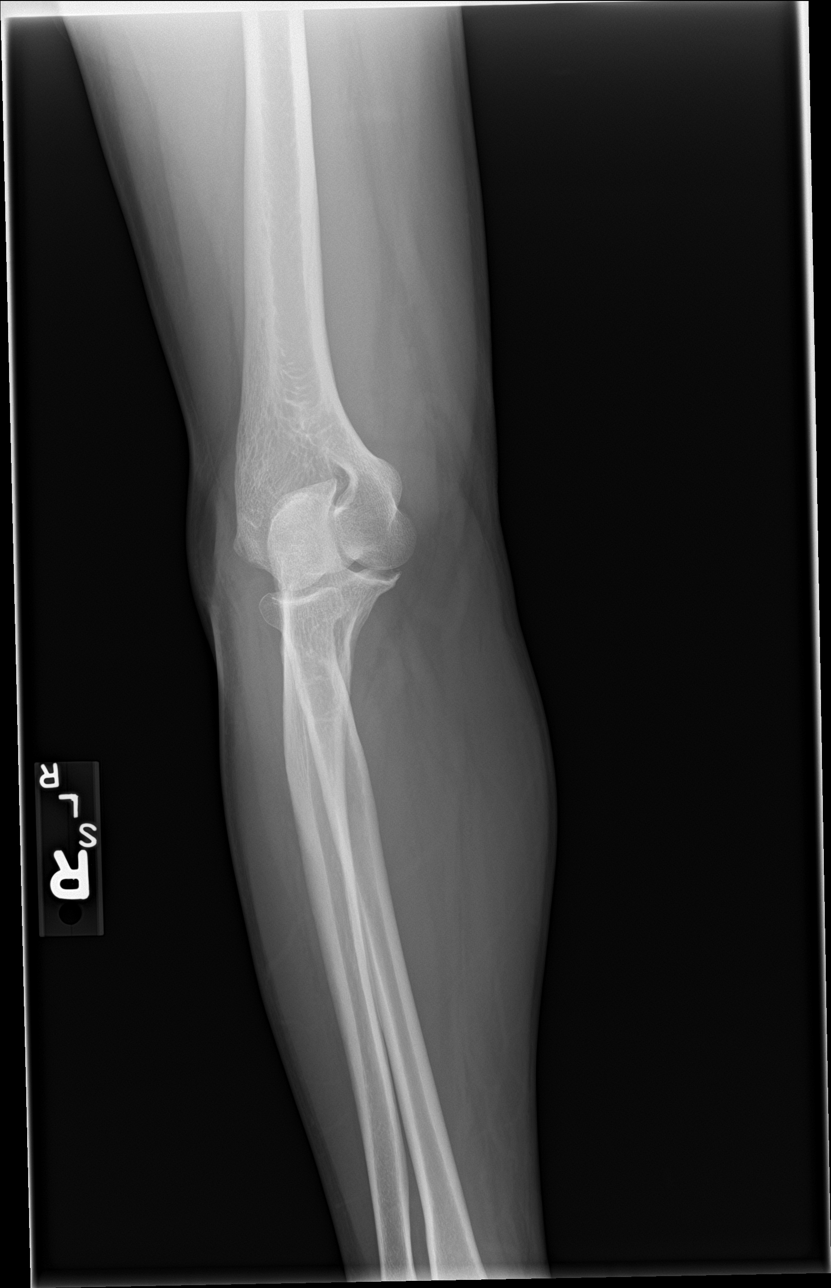

[elbow lat]
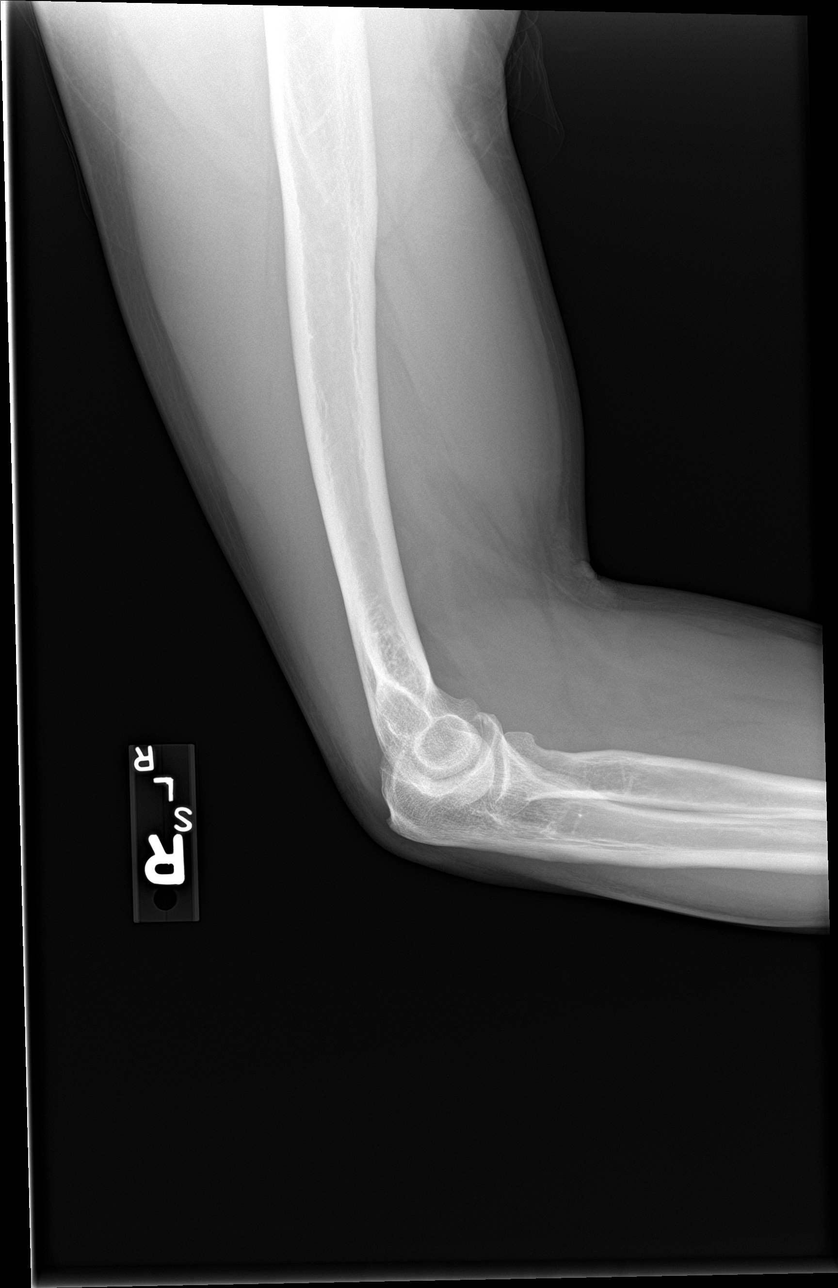

[4 of 4 positions shown; findings below may reference images not displayed]

FINDINGS: There is no evidence of fracture, dislocation, or joint effusion.
There is no evidence of arthropathy or other focal bone abnormality.
Soft tissues are unremarkable.
IMPRESSION: Negative.

## 2022-11-19 ENCOUNTER — Ambulatory Visit (INDEPENDENT_AMBULATORY_CARE_PROVIDER_SITE_OTHER): Payer: Self-pay | Admitting: Family Medicine

## 2022-11-19 VITALS — BP 134/82 | Ht 63.0 in | Wt 170.0 lb

## 2022-11-19 DIAGNOSIS — M25562 Pain in left knee: Secondary | ICD-10-CM

## 2022-11-19 DIAGNOSIS — M25561 Pain in right knee: Secondary | ICD-10-CM

## 2022-11-19 DIAGNOSIS — G8929 Other chronic pain: Secondary | ICD-10-CM

## 2022-11-19 MED ORDER — MELOXICAM 15 MG PO TABS: 15.0000 mg | ORAL_TABLET | Freq: Every day | ORAL | 2 refills | Status: DC

## 2022-11-19 NOTE — Progress Notes (Unsigned)
PCP: Patient, No Pcp Per  Subjective:   HPI: Patient is a 48 y.o. male here for bilateral knee pain. Started with the left knee about 7 months ago, pain was manageable. Then 3 months ago, the right knees started which is also when the left knee started to get worse. Aggravating factors include climbing steps, moving too fast and certain positions. Works as a Financial risk analyst, if he is moving fast then that causes the knee pain to get worse. He seems to only have pain when he is active but minimal pain and aching sensation at rest. Denies any radiating pain. Denies any known trauma or injury. He has never had this occurred before. Denies any activity changes that initiated the development of his pain. Denies any associated numbness or tingling.    Past Medical History:  Diagnosis Date   History of kidney stones    Lateral epicondylitis of right elbow 08/21/2021    Current Outpatient Medications on File Prior to Visit  Medication Sig Dispense Refill   acetaminophen (TYLENOL) 500 MG tablet Take 500-1,000 mg by mouth every 6 (six) hours as needed (PAIN/HEADACHES.).     CALCIUM PO Take 1 tablet by mouth daily.     Cholecalciferol (VITAMIN D3 PO) Take 1 tablet by mouth in the morning.     No current facility-administered medications on file prior to visit.    Past Surgical History:  Procedure Laterality Date   IRRIGATION AND DEBRIDEMENT ELBOW Right 09/13/2021   Procedure: Right lateral epicondyle DEBRIDEMENT ELBOW;  Surgeon: Marlyne Beards, MD;  Location: [facility];  Service: Orthopedics;  Laterality: Right;    No Known Allergies  BP 134/82   Ht  (1.6 m)   Wt 170 lb (77.1 kg)   BMI 30.11 kg/m      05/09/2020    8:32 AM  [facility]  Frequency of aerobic exercise (# of days/week) 3  Average time in minutes 30  Frequency of strengthening activities (# of days/week) 3        No data to display              Objective:  Physical Exam:  Gen: NAD,  comfortable in exam room MSK:  Bilateral knees Inspection: no gross deformity or effusion noted, no erythema noted Palpation: no tenderness to palpation of the knees bilaterally  ROM: full active ROM intact but pain elicited with flexion left>right  Strength: 5/5 LE strength bilaterally, weakness of hip abduction bilaterally  Special testing: negative anterior and posterior drawer testing, negative Lachman's testing, negative McMurray's and Thessaly's testing  Neuro: gross sensation intact, normal gait    Assessment & Plan:  1. Bilateral knee pain: Likely secondary to patellofemoral syndrome. Providing with rehab exercises for quad strengthening and advised with conservative measures. Prescribed meloxicam. Follow up in 6 weeks, consider PT if pain fails to improve or persist.

## 2022-11-19 NOTE — Patient Instructions (Signed)
You have patellofemoral syndrome. Avoid painful activities when possible (often deep squats, lunges, leg press bother this). Do home exercises on both sides once a day. Add ankle weight if these become too easy. Consider formal physical therapy. Avoid flat shoes, barefoot walking as much as possible. Icing 15 minutes at a time 3-4 times a day as needed. Meloxicam  daily with food for pain and inflammation - do not take aleve or ibuprofen while taking this. Follow up with me in 6 weeks.

## 2022-11-20 ENCOUNTER — Encounter: Payer: Self-pay | Admitting: Family Medicine

## 2022-11-22 ENCOUNTER — Ambulatory Visit (INDEPENDENT_AMBULATORY_CARE_PROVIDER_SITE_OTHER): Payer: Self-pay | Admitting: Orthopaedic Surgery

## 2022-11-22 ENCOUNTER — Other Ambulatory Visit (INDEPENDENT_AMBULATORY_CARE_PROVIDER_SITE_OTHER): Payer: Self-pay

## 2022-11-22 ENCOUNTER — Encounter: Payer: Self-pay | Admitting: Orthopaedic Surgery

## 2022-11-22 DIAGNOSIS — M25561 Pain in right knee: Secondary | ICD-10-CM

## 2022-11-22 DIAGNOSIS — M25562 Pain in left knee: Secondary | ICD-10-CM

## 2022-11-22 DIAGNOSIS — G8929 Other chronic pain: Secondary | ICD-10-CM

## 2022-11-22 MED ORDER — METHYLPREDNISOLONE ACETATE 40 MG/ML IJ SUSP
40.0000 mg | INTRAMUSCULAR | Status: AC | PRN
  Administered 2022-11-22: 40 mg via INTRA_ARTICULAR

## 2022-11-22 MED ORDER — LIDOCAINE HCL 1 % IJ SOLN
2.0000 mL | INTRAMUSCULAR | Status: AC | PRN
  Administered 2022-11-22: 2 mL

## 2022-11-22 MED ORDER — BUPIVACAINE HCL 0.5 % IJ SOLN
2.0000 mL | INTRAMUSCULAR | Status: AC | PRN
  Administered 2022-11-22: 2 mL via INTRA_ARTICULAR

## 2022-11-22 NOTE — Progress Notes (Signed)
Office Visit Note   Patient: Evan Nunez           Date of Birth: 1976/05/11           MRN: [id number] Visit Date: 11/22/2022              Requested by: No referring provider defined for this encounter. PCP: Patient, No Pcp Per   Assessment & Plan: Visit Diagnoses:  1. Chronic pain of both knees     Plan: Impression is 47 year old gentleman with left knee osteoarthritis.  Nonsurgical treatments discussed extensively.  Patient does not have any degenerative changes yet.  Bilateral steroid injections performed today.  Home exercises provided.  He will try Voltaren gel.  Questions encouraged and answered.  Follow-up as needed.  Follow-Up Instructions: No follow-ups on file.   Orders:  Orders Placed This Encounter  Procedures   Large Joint Inj: bilateral knee   XR KNEE 3 VIEW LEFT   XR KNEE 3 VIEW RIGHT   No orders of the defined types were placed in this encounter.     Procedures: Large Joint Inj: bilateral knee on 11/22/2022 9:33 AM Indications: pain Details: 22 G needle  Arthrogram: No  Medications (Right): 2 mL lidocaine 1 %; 2 mL bupivacaine 0.5 %; 40 mg methylPREDNISolone acetate 40 MG/ML Medications (Left): 2 mL lidocaine 1 %; 2 mL bupivacaine 0.5 %; 40 mg methylPREDNISolone acetate 40 MG/ML Outcome: tolerated well, no immediate complications Patient was prepped and draped in the usual sterile fashion.       Clinical Data: No additional findings.   Subjective: Chief Complaint  Patient presents with   Right Knee - Pain   Left Knee - Pain    HPI  Patient is a 47 year old healthy Hispanic gentleman here for bilateral knee pain for the last several months.  Feels burning pain as well as needle sensation.  Pain is worse with kneeling or using stairs.  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  Motrin provides temporary relief.  Denies any previous injuries or surgeries.  Review of Systems  Constitutional: Negative.   HENT: Negative.     Eyes: Negative.   Respiratory: Negative.    Cardiovascular: Negative.   Gastrointestinal: Negative.   Endocrine: Negative.   Genitourinary: Negative.   Skin: Negative.   Allergic/Immunologic: Negative.   Neurological: Negative.   Hematological: Negative.   Psychiatric/Behavioral: Negative.    All other systems reviewed and are negative.    Objective: Vital Signs: There were no vitals taken for this visit.  Physical Exam Vitals and nursing note reviewed.  Constitutional:      Appearance: He is well-developed.  Pulmonary:     Effort: Pulmonary effort is normal.  Abdominal:     Palpations: Abdomen is soft.  Skin:    General: Skin is warm.  Neurological:     Mental Status: He is alert and oriented to person, place, and time.  Psychiatric:        Behavior: Behavior normal.        Thought Content: Thought content normal.        Judgment: Judgment normal.     Ortho Exam  Examination bilateral knees show no joint effusion.  Good range of motion.  Minimal crepitus.  Collaterals and cruciates are stable.  No joint line tenderness  Specialty Comments:  No specialty comments available.  Imaging: XR KNEE 3 VIEW RIGHT  Result Date: 11/22/2022 No acute or structural abnormalities  XR KNEE 3  VIEW LEFT  Result Date: 11/22/2022 No acute or structural abnormalities    PMFS History: Patient Active Problem List   Diagnosis Date Noted   Lateral epicondylitis of right elbow 08/21/2021   Peroneal tendon injury 03/16/2020   Past Medical History:  Diagnosis Date   History of kidney stones    Lateral epicondylitis of right elbow 08/21/2021    Family History  Problem Relation Age of Onset   Healthy Mother     Past Surgical History:  Procedure Laterality Date   IRRIGATION AND DEBRIDEMENT ELBOW Right 09/13/2021   Procedure: Right lateral epicondyle DEBRIDEMENT ELBOW;  Surgeon: Marlyne Beards, MD;  Location: [facility];  Service: Orthopedics;  Laterality: Right;   Social  History   Occupational History   Not on file  Tobacco Use   Smoking status: Former    Years: 5    Types: Cigarettes    Quit date: 01/14/2020    Years since quitting: 2.8   Smokeless tobacco: Never  Vaping Use   Vaping Use: Not on file  Substance and Sexual Activity   Alcohol use: Yes    Alcohol/week: 12.0 standard drinks of alcohol    Types: 12 Cans of beer per week   Drug use: No   Sexual activity: Not on file

## 2022-12-31 ENCOUNTER — Ambulatory Visit: Payer: Self-pay | Admitting: Family Medicine

## 2023-05-06 ENCOUNTER — Encounter (HOSPITAL_BASED_OUTPATIENT_CLINIC_OR_DEPARTMENT_OTHER): Payer: Self-pay | Admitting: Student

## 2023-05-06 ENCOUNTER — Other Ambulatory Visit (HOSPITAL_BASED_OUTPATIENT_CLINIC_OR_DEPARTMENT_OTHER): Payer: Self-pay

## 2023-05-06 ENCOUNTER — Ambulatory Visit (INDEPENDENT_AMBULATORY_CARE_PROVIDER_SITE_OTHER): Payer: Self-pay

## 2023-05-06 ENCOUNTER — Ambulatory Visit (INDEPENDENT_AMBULATORY_CARE_PROVIDER_SITE_OTHER): Payer: Self-pay | Admitting: Student

## 2023-05-06 DIAGNOSIS — M222X2 Patellofemoral disorders, left knee: Secondary | ICD-10-CM

## 2023-05-06 DIAGNOSIS — M25522 Pain in left elbow: Secondary | ICD-10-CM

## 2023-05-06 DIAGNOSIS — M7712 Lateral epicondylitis, left elbow: Secondary | ICD-10-CM

## 2023-05-06 DIAGNOSIS — M222X1 Patellofemoral disorders, right knee: Secondary | ICD-10-CM

## 2023-05-06 MED ORDER — TRIAMCINOLONE ACETONIDE 40 MG/ML IJ SUSP
2.0000 mL | INTRAMUSCULAR | Status: AC | PRN
  Administered 2023-05-06: 2 mL via INTRA_ARTICULAR

## 2023-05-06 MED ORDER — LIDOCAINE HCL 1 % IJ SOLN
2.0000 mL | INTRAMUSCULAR | Status: AC | PRN
  Administered 2023-05-06: 2 mL

## 2023-05-06 MED ORDER — MELOXICAM 15 MG PO TABS
15.0000 mg | ORAL_TABLET | Freq: Every day | ORAL | 0 refills | Status: AC
  Filled 2023-05-06: qty 14, 14d supply, fill #0

## 2023-05-06 NOTE — Progress Notes (Addendum)
Chief Complaint: Bilateral knee pain and left elbow pain     History of Present Illness:    Evan Nunez is a 47 y.o. right-hand dominant male presenting today for evaluation of pain in both knees as well as his left elbow.  He had bilateral knee cortisone injections with Dr. Roda Nunez on 11/02/2022 which he states did not give him tons of relief.  His left knee hurts worse than the right.  Pain levels are moderate to severe and he states that it feels like a burning sensation deep inside the knees.  This is significantly worsened going up or down stairs or from a sitting to standing position.  The pain in his left elbow has been ongoing for approximately 1 year.  He did have multiple injections and ultimately the lateral epicondyle debridement of the right elbow with Dr. Frazier Nunez and states that this issue feels the same.  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  Has noticed some swelling.  Denies any treatment or previous injury to the left elbow.  Has been taking Tylenol and ibuprofen as needed.   Surgical History:   Right lateral epicondyle debridement-09/13/2021  PMH/PSH/Family History/Social History/Meds/Allergies:    Past Medical History:  Diagnosis Date   History of kidney stones    Lateral epicondylitis of right elbow 08/21/2021   Past Surgical History:  Procedure Laterality Date   IRRIGATION AND DEBRIDEMENT ELBOW Right 09/13/2021   Procedure: Right lateral epicondyle DEBRIDEMENT ELBOW;  Surgeon: Evan Beards, MD;  Location: [facility] OR;  Service: Orthopedics;  Laterality: Right;   Social History   Socioeconomic History   Marital status: Married    Spouse name: Not on file   Number of children: Not on file   Years of education: Not on file   Highest education level: Not on file  Occupational History   Not on file  Tobacco Use   Smoking status: Former    Current packs/day: 0.00    Types:  Cigarettes    Start date: 01/14/2015    Quit date: 01/14/2020    Years since quitting: 3.3   Smokeless tobacco: Never  Vaping Use   Vaping status: Not on file  Substance and Sexual Activity   Alcohol use: Yes    Alcohol/week: 12.0 standard drinks of alcohol    Types: 12 Cans of beer per week   Drug use: No   Sexual activity: Not on file  Other Topics Concern   Not on file  Social History Narrative   Not on file   Social Determinants of Health   Financial Resource Strain: Not on file  Food Insecurity: Not on file  Transportation Needs: Not on file  Physical Activity: Not on file  Stress: Not on file  Social Connections: Not on file   Family History  Problem Relation Age of Onset   Healthy Mother    No Known Allergies Current Outpatient Medications  Medication Sig Dispense Refill   meloxicam (MOBIC) 15 MG tablet Take 1 tablet (15 mg total) by mouth daily for 14 days. 14 tablet 0   acetaminophen (TYLENOL) 500 MG tablet Take 500-1,000 mg by mouth every 6 (six) hours as needed (PAIN/HEADACHES.).     CALCIUM PO Take 1 tablet by mouth daily.     Cholecalciferol (  VITAMIN D3 PO) Take 1 tablet by mouth in the morning.     meloxicam (MOBIC) 15 MG tablet Take 1 tablet (15 mg total) by mouth daily. 30 tablet 2   No current facility-administered medications for this visit.   No results found.  Review of Systems:   A ROS was performed including pertinent positives and negatives as documented in the HPI.  Physical Exam :   Constitutional: NAD and appears stated age Neurological: Alert and oriented Psych: Appropriate affect and cooperative There were no vitals taken for this visit.   Comprehensive Musculoskeletal Exam:    Active range of motion of bilateral knees from 0 to 130 degrees without crepitus.  No significant joint line tenderness.  Negative for any notable edema or effusion.  No instability with varus or valgus stress bilaterally. Tenderness palpation of the left elbow  over the lateral epicondyle and proximal extensor muscles.  Active elbow ROM from 0 to 120 degrees.  Discomfort with resisted wrist extension.  Good strength with resisted pronation and supination without discomfort.  Radial pulse 2+.  Grip strength 5/5 bilaterally.  Imaging:   Xray (left elbow 4 views): Negative   I personally reviewed and interpreted the radiographs.   Assessment:   47 y.o. male with bilateral knee pain.  Based off his reported symptoms and that he did not get much relief with previous cortisone injections, I believe this is consistent with patellofemoral syndrome.  I would like to get him in to work with physical therapy on a patellofemoral strengthening program.  Will also send a short course of meloxicam to help with pain and inflammation.  His left elbow pain is very consistent with lateral epicondylitis.  Given his history of conservative and surgical management for this issue of his right elbow, I have recommended beginning treatment with a lateral epicondyle cortisone injection.  Patient is agreeable to this and this was performed in clinic today without any complication.  I would like to see him back in 5 weeks for follow-up evaluation.  Plan :    -Left elbow lateral epicondyle injection performed today after patient consent -Referral to physical therapy for bilateral patellofemoral strengthening program -Start meloxicam 15 mg -Return to clinic in 5 weeks for reassessment     Procedure Note  Patient: Evan Nunez             Date of Birth: [birth year]           MRN: [id number]             Visit Date: 05/06/2023  Procedures: Visit Diagnoses:  1. Lateral epicondylitis, left elbow   2. Patellofemoral syndrome of both knees     Medium Joint Inj: L lateral epicondyle on 05/06/2023 12:02 PM Indications: pain Details: 25 G 1.5 in needle, lateral approach Medications: 2 mL lidocaine 1 %; 2 mL triamcinolone acetonide 40 MG/ML Outcome: tolerated well, no  immediate complications Procedure, treatment alternatives, risks and benefits explained, specific risks discussed. Consent was given by the patient. Immediately prior to procedure a time out was called to verify the correct patient, procedure, equipment, support staff and site/side marked as required. Patient was prepped and draped in the usual sterile fashion.      I personally saw and evaluated the patient, and participated in the management and treatment plan.  Hazle Nordmann, PA-C Orthopedics

## 2023-05-08 ENCOUNTER — Other Ambulatory Visit: Payer: Self-pay

## 2023-05-08 ENCOUNTER — Ambulatory Visit: Payer: Self-pay | Attending: Student

## 2023-05-08 DIAGNOSIS — M222X1 Patellofemoral disorders, right knee: Secondary | ICD-10-CM | POA: Insufficient documentation

## 2023-05-08 DIAGNOSIS — M6281 Muscle weakness (generalized): Secondary | ICD-10-CM

## 2023-05-08 DIAGNOSIS — M222X2 Patellofemoral disorders, left knee: Secondary | ICD-10-CM | POA: Insufficient documentation

## 2023-05-08 DIAGNOSIS — R252 Cramp and spasm: Secondary | ICD-10-CM

## 2023-05-08 DIAGNOSIS — R262 Difficulty in walking, not elsewhere classified: Secondary | ICD-10-CM

## 2023-05-08 DIAGNOSIS — M25562 Pain in left knee: Secondary | ICD-10-CM

## 2023-05-08 DIAGNOSIS — M25561 Pain in right knee: Secondary | ICD-10-CM

## 2023-05-08 NOTE — Therapy (Signed)
OUTPATIENT PHYSICAL THERAPY LOWER EXTREMITY EVALUATION   Patient Name: Evan Nunez MRN: [id number] DOB:03-15-1976, 47 y.o., male Today's Date: 05/08/2023  END OF SESSION:  PT End of Session - 05/08/23 1120     Visit Number 1    Date for PT Re-Evaluation 07/03/23    PT Start Time 1105    PT Stop Time 1145    PT Time Calculation (min) 40 min    Activity Tolerance Patient tolerated treatment well    Behavior During Therapy [facility] for tasks assessed/performed             Past Medical History:  Diagnosis Date   History of kidney stones    Lateral epicondylitis of right elbow 08/21/2021   Past Surgical History:  Procedure Laterality Date   IRRIGATION AND DEBRIDEMENT ELBOW Right 09/13/2021   Procedure: Right lateral epicondyle DEBRIDEMENT ELBOW;  Surgeon: Marlyne Beards, MD;  Location: [facility] OR;  Service: Orthopedics;  Laterality: Right;   Patient Active Problem List   Diagnosis Date Noted   Lateral epicondylitis of right elbow 08/21/2021   Peroneal tendon injury 03/16/2020    PCP: Patient, No Pcp Per  REFERRING PROVIDER: Amador Cunas, PA-C  REFERRING DIAG:  Diagnosis  M22.2X1,M22.2X2 (ICD-10-CM) - Patellofemoral syndrome of both knees    THERAPY DIAG:  Acute pain of right knee - Plan: PT plan of care cert/re-cert  Acute pain of left knee - Plan: PT plan of care cert/re-cert  Muscle weakness (generalized) - Plan: PT plan of care cert/re-cert  Cramp and spasm - Plan: PT plan of care cert/re-cert  Difficulty in walking, not elsewhere classified - Plan: PT plan of care cert/re-cert  Rationale for Evaluation and Treatment: Rehabilitation  ONSET DATE: 05/06/2023  SUBJECTIVE:   SUBJECTIVE STATEMENT: Patient reports bilateral anterior knee pain for about 8 months.  He injured his right gastroc severely and had to be non and partial weight bearing for a while which caused him to become somewhat weak in his legs.  He wanted to get back to workouts and being  active so he tried to start working out and began having bilateral anterior knee pain.  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  He doesn't know how to regain his strength without hurting his knees.   He hopes to avoid surgery and be able to resume his level of activity prior to the calf injury.  PERTINENT HISTORY: na PAIN:  Are you having pain? Yes: NPRS scale: 3/10 Pain location: bilateral anterior knees Pain description: aching Aggravating factors: bending, stooping squatting, stairs Relieving factors: rest, ice, meds  PRECAUTIONS: None  RED FLAGS: None   WEIGHT BEARING RESTRICTIONS: No  FALLS:  Has patient fallen in last 6 months? No  LIVING ENVIRONMENT: Lives with: lives with their family Lives in: House/apartment Stairs: Yes: Internal: 10 steps; on right going up and External: 5 steps; on right going up Has following equipment at home: None  OCCUPATION: chef  PLOF: Independent, Independent with basic ADLs, Independent with household mobility without device, Independent with community mobility without device, Independent with homemaking with ambulation, Independent with gait, and Independent with transfers  PATIENT GOALS: He hopes to avoid surgery and be able to resume his level of activity prior to the calf injury  NEXT  MD VISIT: prn  OBJECTIVE:  Note: Objective measures were completed at Evaluation unless otherwise noted.  DIAGNOSTIC FINDINGS: xrays normal   PATIENT SURVEYS:  LEFS 35:  patient is at 44% of normal functional level  COGNITION: Overall cognitive status: Within functional limits for tasks assessed     SENSATION: WFL  POSTURE: No Significant postural limitations  PALPATION: Minimal crepitus noted on seated  flexion/extension  LOWER EXTREMITY ROM:  WNL  LOWER EXTREMITY MMT:  Generally 4+ to 5/5 bilateral LE's  LOWER EXTREMITY SPECIAL TESTS:  Knee special tests: Patellafemoral apprehension test: negative and Patellafemoral grind test: negative, Positive theatre sign  FUNCTIONAL TESTS:  5 times sit to stand: 18.01 sec Timed up and go (TUG): 11.63 sec  GAIT: Distance walked: 50 feet Assistive device utilized: None Level of assistance: Complete Independence Comments: Normal   TODAY'S TREATMENT:                                                                                                                              DATE: 05/08/23 Initial eval completed and initiated HEP    PATIENT EDUCATION:  Education details: Initiated HEP, educated on anatomy of the knee and the likely cause of his knee pain  Person educated: Patient Education method: Programmer, multimedia, Demonstration, Verbal cues, and Handouts Education comprehension: verbalized understanding, returned demonstration, and verbal cues required  HOME EXERCISE PROGRAM: Access Code: [email or web address] URL: [email or web address] Date: 05/08/2023 Prepared by: Mikey Kirschner  Exercises - Supine Quadricep Sets  - 1 x daily - 7 x weekly - 3 sets - 10 reps - Supine Knee Extension Strengthening  - 1 x daily - 7 x weekly - 3 sets - 10 reps - Small Range Straight Leg Raise  - 1 x daily - 7 x weekly - 3 sets - 10 reps  ASSESSMENT:  CLINICAL IMPRESSION: Patient is a 47 y.o. male who was seen today for physical therapy evaluation and treatment for bilateral anterior knee pain.  He presents with positive theatre sign and quad atrophy for his level of activity and fitness.  No significant crepitus on examination today but patients' symptoms are all consistent with patellofemoral pain.  He should respond well to skilled PT for quad rehab, hip strengthening and alignment training along with ice to control inflammation.    OBJECTIVE  IMPAIRMENTS: decreased knowledge of condition, difficulty walking, decreased strength, increased fascial restrictions, increased muscle spasms, and pain.   ACTIVITY LIMITATIONS: carrying, lifting, bending, sitting, standing, squatting, sleeping, stairs, and transfers  PARTICIPATION LIMITATIONS: meal prep, cleaning, laundry, driving, shopping, community activity, occupation, yard work, and church  PERSONAL FACTORS: Fitness, Past/current experiences, Profession, and Time since onset of injury/illness/exacerbation are also affecting patient's functional outcome.   REHAB POTENTIAL: Good  CLINICAL DECISION MAKING: Stable/uncomplicated  EVALUATION COMPLEXITY: Low   GOALS: Goals reviewed with patient? Yes  SHORT TERM GOALS: Target date: 06/05/2023  Pain report to be no greater than 4/10  Baseline: Goal status: INITIAL  2.  Patient will be independent with initial HEP  Baseline:  Goal status: INITIAL  3.  Patient to be able work without use of knee sleeves Baseline:  Goal status: INITIAL   LONG TERM GOALS: Target date: 07/03/2023   Patient to report pain no greater than 2/10  Baseline:  Goal status: INITIAL  2.  Patient to be independent with advanced HEP  Baseline:  Goal status: INITIAL  3.  Patient to be able to ascend and descend steps without pain or no greater than 2/10  Baseline:  Goal status: INITIAL  4.  Patient to be able to bend, stoop and squat with pain no greater than 2/10  Baseline:  Goal status: INITIAL  5.  Patient to be able to work full shift at work without pain Baseline:  Goal status: INITIAL  6.  Patient to report 85% improvement in overall symptoms Baseline:  Goal status: INITIAL   PLAN:  PT FREQUENCY: 1-2x/week  PT DURATION: 8 weeks  PLANNED INTERVENTIONS: Therapeutic exercises, Therapeutic activity, Neuromuscular re-education, Balance training, Gait training, Patient/Family education, Self Care, Joint mobilization, Stair training,  Aquatic Therapy, Dry Needling, Electrical stimulation, Cryotherapy, Moist heat, Splintting, Taping, Vasopneumatic device, Ultrasound, Ionotophoresis 4mg /ml Dexamethasone, Manual therapy, and Re-evaluation  PLAN FOR NEXT SESSION: Review HEP, Nustep, begin quad rehab   [place] B. Dewon Mendizabal, PT 05/08/23 4:19 PM [facility] [address] [place], [place] [address] Phone # [phone] Fax [phone]

## 2023-05-20 ENCOUNTER — Ambulatory Visit: Payer: Self-pay | Admitting: Physical Therapy

## 2023-05-20 DIAGNOSIS — M6281 Muscle weakness (generalized): Secondary | ICD-10-CM

## 2023-05-20 DIAGNOSIS — R262 Difficulty in walking, not elsewhere classified: Secondary | ICD-10-CM

## 2023-05-20 DIAGNOSIS — M25561 Pain in right knee: Secondary | ICD-10-CM

## 2023-05-20 DIAGNOSIS — R252 Cramp and spasm: Secondary | ICD-10-CM

## 2023-05-20 DIAGNOSIS — M25562 Pain in left knee: Secondary | ICD-10-CM

## 2023-05-20 NOTE — Therapy (Addendum)
OUTPATIENT PHYSICAL THERAPY TREATMENT NOTE AND LATE ENTRY DISCHARGE SUMMARY   Patient Name: Evan Nunez MRN: [id number] DOB:Aug 20, 1975, 47 y.o., male Today's Date: 05/20/2023  END OF SESSION:  PT End of Session - 05/20/23 0903     Visit Number 2    Date for PT Re-Evaluation 07/03/23    PT Start Time 0848    PT Stop Time 0928    PT Time Calculation (min) 40 min    Activity Tolerance Patient tolerated treatment well    Behavior During Therapy [facility] for tasks assessed/performed              Past Medical History:  Diagnosis Date   History of kidney stones    Lateral epicondylitis of right elbow 08/21/2021   Past Surgical History:  Procedure Laterality Date   IRRIGATION AND DEBRIDEMENT ELBOW Right 09/13/2021   Procedure: Right lateral epicondyle DEBRIDEMENT ELBOW;  Surgeon: Marlyne Beards, MD;  Location: [facility] OR;  Service: Orthopedics;  Laterality: Right;   Patient Active Problem List   Diagnosis Date Noted   Lateral epicondylitis of right elbow 08/21/2021   Peroneal tendon injury 03/16/2020    PCP: Patient, No Pcp Per  REFERRING PROVIDER: Amador Cunas, PA-C  REFERRING DIAG:  Diagnosis  M22.2X1,M22.2X2 (ICD-10-CM) - Patellofemoral syndrome of both knees    THERAPY DIAG:  Acute pain of right knee  Muscle weakness (generalized)  Acute pain of left knee  Cramp and spasm  Difficulty in walking, not elsewhere classified  Rationale for Evaluation and Treatment: Rehabilitation  ONSET DATE: 05/06/2023  SUBJECTIVE:   SUBJECTIVE STATEMENT: Patient states that he has noted some improvements since starting his HEP. He continues to have pain, but it not as bad as it has been in the past.   Patient reports bilateral anterior knee pain for about 8 months.  He injured his right gastroc severely and had to be non and partial weight bearing for a while which caused him to become somewhat weak in his legs.  He wanted to get back to workouts and being active so he  tried to start working out and began having bilateral anterior knee pain.  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  He doesn't know how to regain his strength without hurting his knees.   He hopes to avoid surgery and be able to resume his level of activity prior to the calf injury.  PERTINENT HISTORY: na PAIN:  Are you having pain? Yes: NPRS scale: 3/10 Pain location: bilateral anterior knees Pain description: aching Aggravating factors: bending, stooping squatting, stairs Relieving factors: rest, ice, meds  PRECAUTIONS: None  RED FLAGS: None   WEIGHT BEARING RESTRICTIONS: No  FALLS:  Has patient fallen in last 6 months? No  LIVING ENVIRONMENT: Lives with: lives with their family Lives in: House/apartment Stairs: Yes: Internal: 10 steps; on right going up and External: 5 steps; on right going up Has following equipment at home: None  OCCUPATION: chef  PLOF: Independent, Independent with basic ADLs, Independent with household mobility without device, Independent with community mobility without device, Independent with homemaking with ambulation, Independent with gait, and Independent with transfers  PATIENT GOALS: He hopes to avoid surgery and be able to resume his level of activity prior to the calf injury  NEXT MD VISIT: prn  OBJECTIVE:  Note: Objective measures were completed at Evaluation unless otherwise noted.  DIAGNOSTIC FINDINGS: xrays normal   PATIENT SURVEYS:  LEFS 35:  patient is at 44% of normal functional level  COGNITION: Overall cognitive status: Within functional limits for tasks assessed     SENSATION: WFL  POSTURE: No Significant postural limitations  PALPATION: Minimal crepitus noted on seated flexion/extension  LOWER  EXTREMITY ROM:  WNL  LOWER EXTREMITY MMT:  Generally 4+ to 5/5 bilateral LE's  LOWER EXTREMITY SPECIAL TESTS:  Knee special tests: Patellafemoral apprehension test: negative and Patellafemoral grind test: negative, Positive theatre sign  FUNCTIONAL TESTS:  5 times sit to stand: 18.01 sec Timed up and go (TUG): 11.63 sec  GAIT: Distance walked: 50 feet Assistive device utilized: None Level of assistance: Complete Independence Comments: Normal   TODAY'S TREATMENT:  Date: 05/20/2023: - Nustep 5 min, lvl 5. PT present for subjective.  - Supine bridges with cues for glute activation x20  - Sideling clams with green TB 2x10, bilat  - Sideling hip abduction 2x10, bilat      - Sit to stands with 15lb kettle bell 2x15  - Side stepping with GTB x10 laps by low mat  - Double leg press with 95lbs x15  - SL press with 55lbs x15, bilat  - Step up to 6" step x15 fwd, x15 lateral, bilat  - Calf stretch on slant board - gastroc and soleus.                                                                                                            DATE: 05/08/23 Initial eval completed and initiated HEP    PATIENT EDUCATION:  Education details: Initiated HEP, educated on anatomy of the knee and the likely cause of his knee pain  Person educated: Patient Education method: Programmer, multimedia, Demonstration, Verbal cues, and Handouts Education comprehension: verbalized understanding, returned demonstration, and verbal cues required  HOME EXERCISE PROGRAM: Access Code: [email or web address] URL: [email or web address] Date: 05/08/2023 Prepared by: Mikey Kirschner  Exercises - Supine Quadricep Sets  - 1 x daily - 7 x weekly - 3 sets - 10 reps - Supine Knee Extension Strengthening  - 1 x daily - 7 x weekly - 3 sets - 10 reps - Small Range Straight Leg Raise  - 1 x daily - 7 x weekly - 3 sets - 10 reps  ASSESSMENT:  CLINICAL IMPRESSION: Pt reports to first follow up appt with reports of  improved mobility and a slight reduction in pain. He states he has been active with his HEP which has been beneficial. He notes less cramping and stiffness. Pt demonstrated slight extensor lag noted with SLR with improvements noted with cues. Challenged pt with bilat LE strengthening with no reports of pain, food form and mild fatigue. Pt continuously repeated how good exercises felt. Updated HEP. Pt will continue to benefit from skilled PT to address continued deficits.   OBJECTIVE IMPAIRMENTS: decreased knowledge of condition, difficulty walking, decreased strength, increased fascial restrictions, increased muscle spasms, and  pain.   ACTIVITY LIMITATIONS: carrying, lifting, bending, sitting, standing, squatting, sleeping, stairs, and transfers  PARTICIPATION LIMITATIONS: meal prep, cleaning, laundry, driving, shopping, community activity, occupation, yard work, and church  PERSONAL FACTORS: Fitness, Past/current experiences, Profession, and Time since onset of injury/illness/exacerbation are also affecting patient's functional outcome.   REHAB POTENTIAL: Good  CLINICAL DECISION MAKING: Stable/uncomplicated  EVALUATION COMPLEXITY: Low   GOALS: Goals reviewed with patient? Yes  SHORT TERM GOALS: Target date: 06/05/2023  Pain report to be no greater than 4/10  Baseline: Goal status: INITIAL  2.  Patient will be independent with initial HEP  Baseline:  Goal status: INITIAL  3.  Patient to be able work without use of knee sleeves Baseline:  Goal status: INITIAL   LONG TERM GOALS: Target date: 07/03/2023   Patient to report pain no greater than 2/10  Baseline:  Goal status: INITIAL  2.  Patient to be independent with advanced HEP  Baseline:  Goal status: INITIAL  3.  Patient to be able to ascend and descend steps without pain or no greater than 2/10  Baseline:  Goal status: INITIAL  4.  Patient to be able to bend, stoop and squat with pain no greater than 2/10  Baseline:   Goal status: INITIAL  5.  Patient to be able to work full shift at work without pain Baseline:  Goal status: INITIAL  6.  Patient to report 85% improvement in overall symptoms Baseline:  Goal status: INITIAL   PLAN:  PT FREQUENCY: 1-2x/week  PT DURATION: 8 weeks  PLANNED INTERVENTIONS: Therapeutic exercises, Therapeutic activity, Neuromuscular re-education, Balance training, Gait training, Patient/Family education, Self Care, Joint mobilization, Stair training, Aquatic Therapy, Dry Needling, Electrical stimulation, Cryotherapy, Moist heat, Splintting, Taping, Vasopneumatic device, Ultrasound, Ionotophoresis 4mg /ml Dexamethasone, Manual therapy, and Re-evaluation  PLAN FOR NEXT SESSION: Review HEP, Nustep, begin quad rehab  [employer] PT, DPT 05/20/23  9:30 AM     PHYSICAL THERAPY DISCHARGE SUMMARY  Pt has not returned for a visit since 05/20/23 and had 2 'no-shows'.  Patient discharged at this time per attendance policy.  Patient agrees to discharge. Patient goals were partially met. Patient is being discharged due to not returning since the last visit.  Clydie Braun Menke, PT, DPT 06/10/23, 8:04 AM

## 2023-05-27 ENCOUNTER — Ambulatory Visit: Payer: Self-pay | Admitting: Rehabilitative and Restorative Service Providers"

## 2023-05-27 ENCOUNTER — Telehealth: Payer: Self-pay | Admitting: Rehabilitative and Restorative Service Providers"

## 2023-05-27 NOTE — Telephone Encounter (Signed)
Called and left message with patient secondary to missed visit.  Notified patient to please contact the office is he wanted to try to reschedule and reminded of next scheduled appointment.

## 2023-06-03 ENCOUNTER — Ambulatory Visit (HOSPITAL_BASED_OUTPATIENT_CLINIC_OR_DEPARTMENT_OTHER): Payer: Self-pay | Admitting: Student

## 2023-06-03 ENCOUNTER — Telehealth: Payer: Self-pay | Admitting: Rehabilitative and Restorative Service Providers"

## 2023-06-03 ENCOUNTER — Ambulatory Visit: Payer: Self-pay | Attending: Student | Admitting: Rehabilitative and Restorative Service Providers"

## 2023-06-03 DIAGNOSIS — M6281 Muscle weakness (generalized): Secondary | ICD-10-CM | POA: Insufficient documentation

## 2023-06-03 DIAGNOSIS — M25561 Pain in right knee: Secondary | ICD-10-CM | POA: Insufficient documentation

## 2023-06-03 DIAGNOSIS — M222X1 Patellofemoral disorders, right knee: Secondary | ICD-10-CM | POA: Insufficient documentation

## 2023-06-03 DIAGNOSIS — M25562 Pain in left knee: Secondary | ICD-10-CM | POA: Insufficient documentation

## 2023-06-03 DIAGNOSIS — M222X2 Patellofemoral disorders, left knee: Secondary | ICD-10-CM | POA: Insufficient documentation

## 2023-06-03 DIAGNOSIS — R262 Difficulty in walking, not elsewhere classified: Secondary | ICD-10-CM | POA: Insufficient documentation

## 2023-06-03 NOTE — Telephone Encounter (Signed)
Patient was a no-show for PT appointment scheduled on 06/03/2023.  He was called and message left to notify that due to attendance policy and this being his 2nd no-show appointment, any remaining visits will be cancelled and if he would like to continue to come to PT, he may call and schedule only 1 visit at a time.  Will discharge patient from PT if he does not return visit to schedule within next week.

## 2023-07-30 ENCOUNTER — Emergency Department (HOSPITAL_BASED_OUTPATIENT_CLINIC_OR_DEPARTMENT_OTHER): Payer: Self-pay | Admitting: Radiology

## 2023-07-30 ENCOUNTER — Emergency Department (HOSPITAL_BASED_OUTPATIENT_CLINIC_OR_DEPARTMENT_OTHER)
Admission: EM | Admit: 2023-07-30 | Discharge: 2023-07-30 | Disposition: A | Discharge status: Home / Self Care | Payer: Self-pay | Attending: Emergency Medicine | Admitting: Emergency Medicine

## 2023-07-30 ENCOUNTER — Other Ambulatory Visit: Payer: Self-pay

## 2023-07-30 ENCOUNTER — Other Ambulatory Visit (HOSPITAL_BASED_OUTPATIENT_CLINIC_OR_DEPARTMENT_OTHER): Payer: Self-pay

## 2023-07-30 ENCOUNTER — Encounter (HOSPITAL_BASED_OUTPATIENT_CLINIC_OR_DEPARTMENT_OTHER): Payer: Self-pay | Admitting: *Deleted

## 2023-07-30 DIAGNOSIS — Z20822 Contact with and (suspected) exposure to covid-19: Secondary | ICD-10-CM | POA: Insufficient documentation

## 2023-07-30 DIAGNOSIS — J069 Acute upper respiratory infection, unspecified: Secondary | ICD-10-CM | POA: Insufficient documentation

## 2023-07-30 DIAGNOSIS — R0789 Other chest pain: Secondary | ICD-10-CM | POA: Insufficient documentation

## 2023-07-30 LAB — BASIC METABOLIC PANEL
Anion gap: 10 (ref 5–15)
BUN: 8 mg/dL (ref 6–20)
CO2: 23 mmol/L (ref 22–32)
Calcium: 8.7 mg/dL — ABNORMAL LOW (ref 8.9–10.3)
Chloride: 105 mmol/L (ref 98–111)
Creatinine, Ser: 0.69 mg/dL (ref 0.61–1.24)
GFR, Estimated: >60 mL/min (ref >60)
Glucose, Bld: 106 mg/dL — ABNORMAL HIGH (ref 70–99)
Potassium: 3.8 mmol/L (ref 3.5–5.1)
Sodium: 138 mmol/L (ref 135–145)

## 2023-07-30 LAB — CBC WITH DIFFERENTIAL/PLATELET
Abs Immature Granulocytes: 0.02 10*3/uL (ref 0.00–0.07)
Basophils Absolute: 0.1 10*3/uL (ref 0.0–0.1)
Basophils Relative: 1 %
Eosinophils Absolute: 0.1 10*3/uL (ref 0.0–0.5)
Eosinophils Relative: 1 %
HCT: 43 % (ref 39.0–52.0)
Hemoglobin: 14.9 g/dL (ref 13.0–17.0)
Immature Granulocytes: 0 %
Lymphocytes Relative: 13 %
Lymphs Abs: 1.4 10*3/uL (ref 0.7–4.0)
MCH: 30.1 pg (ref 26.0–34.0)
MCHC: 34.7 g/dL (ref 30.0–36.0)
MCV: 86.9 fL (ref 80.0–100.0)
Monocytes Absolute: 0.8 10*3/uL (ref 0.1–1.0)
Monocytes Relative: 8 %
Neutro Abs: 8.3 10*3/uL — ABNORMAL HIGH (ref 1.7–7.7)
Neutrophils Relative %: 77 %
Platelets: 213 10*3/uL (ref 150–400)
RBC: 4.95 MIL/uL (ref 4.22–5.81)
RDW: 12.2 % (ref 11.5–15.5)
WBC: 10.6 10*3/uL — ABNORMAL HIGH (ref 4.0–10.5)
nRBC: 0 % (ref 0.0–0.2)

## 2023-07-30 LAB — RESP PANEL BY RT-PCR (RSV, FLU A&B, COVID)  RVPGX2
Influenza A by PCR: NEGATIVE
Influenza B by PCR: NEGATIVE
Resp Syncytial Virus by PCR: NEGATIVE
SARS Coronavirus 2 by RT PCR: NEGATIVE

## 2023-07-30 LAB — CBG MONITORING, ED: Glucose-Capillary: 113 mg/dL — ABNORMAL HIGH (ref 70–99)

## 2023-07-30 LAB — TROPONIN I (HIGH SENSITIVITY): Troponin I (High Sensitivity): 7 ng/L (ref <18)

## 2023-07-30 LAB — GROUP A STREP BY PCR: Group A Strep by PCR: NOT DETECTED

## 2023-07-30 MED ORDER — KETOROLAC TROMETHAMINE 10 MG PO TABS
10.0000 mg | ORAL_TABLET | Freq: Four times a day (QID) | ORAL | 0 refills | Status: AC | PRN
  Filled 2023-07-30: qty 20, 5d supply, fill #0

## 2023-07-30 MED ORDER — LIDOCAINE VISCOUS HCL 2 % MT SOLN
15.0000 mL | Freq: Once | OROMUCOSAL | Status: AC
  Administered 2023-07-30: 15 mL via OROMUCOSAL
  Filled 2023-07-30: qty 15

## 2023-07-30 MED ORDER — KETOROLAC TROMETHAMINE 60 MG/2ML IM SOLN
30.0000 mg | Freq: Once | INTRAMUSCULAR | Status: AC
  Administered 2023-07-30: 30 mg via INTRAMUSCULAR
  Filled 2023-07-30: qty 2

## 2023-07-30 NOTE — ED Triage Notes (Signed)
 Pt c/o sore throat, feels like his eyes are swollen, cough, headache, dry lips. Unsure of fevers. Symptoms since Friday.

## 2023-07-30 NOTE — ED Notes (Signed)
Walked 2 green and 1 lavender tube to lab with temporary sunquest labels.

## 2023-07-30 NOTE — ED Provider Notes (Signed)
  EMERGENCY DEPARTMENT AT [facility] Provider Note   CSN: [id number] Arrival date & time: 07/30/23  9386     History  Chief Complaint  Patient presents with   Sore Throat    Evan Nunez is a 47 y.o. male.   Sore Throat     47 year old male presenting to the emergency department with multiple complaints.  The patient states that he has had a sore scratchy throat, cough, dry lips, headaches with upper respiratory congestion since this past Friday.  Last night, he also endorsed chest tightness which has since resolved.  No active chest pain he states that he has had no fevers.  Symptoms have been ongoing since this past Friday and worsened over the weekend prompting his presentation to the emergency department.  Home Medications Prior to Admission medications   Medication Sig Start Date End Date Taking? Authorizing Provider  ketorolac  (TORADOL ) 10 MG tablet Take 1 tablet (10 mg total) by mouth every 6 (six) hours as needed. 07/30/23  Yes Jerrol Agent, MD  acetaminophen (TYLENOL) 500 MG tablet Take 500-1,000 mg by mouth every 6 (six) hours as needed (PAIN/HEADACHES.).    [provider]  CALCIUM PO Take 1 tablet by mouth daily.    [provider]  Cholecalciferol (VITAMIN D3 PO) Take 1 tablet by mouth in the morning.    [provider]      Allergies    Patient has no known allergies.    Review of Systems   Review of Systems  All other systems reviewed and are negative.   Physical Exam Updated Vital Signs BP 134/81 (BP Location: Left Arm)   Pulse 81   Temp 99.2 F (37.3 C) (Oral)   Resp 18   SpO2 98%  Physical Exam Vitals and nursing note reviewed.  Constitutional:      General: He is not in acute distress.    Appearance: He is well-developed.  HENT:     Head: Normocephalic and atraumatic.     Mouth/Throat:     Mouth: Mucous membranes are dry.     Pharynx: Posterior oropharyngeal erythema present. No pharyngeal  swelling or oropharyngeal exudate.     Comments: No swelling to suggest PTA, range of motion of the neck intact, bilateral oropharyngeal erythema present without exudate Eyes:     Conjunctiva/sclera: Conjunctivae normal.  Cardiovascular:     Rate and Rhythm: Normal rate and regular rhythm.     Heart sounds: No murmur heard. Pulmonary:     Effort: Pulmonary effort is normal. No respiratory distress.     Breath sounds: Normal breath sounds.  Abdominal:     Palpations: Abdomen is soft.     Tenderness: There is no abdominal tenderness.  Musculoskeletal:        General: No swelling.     Cervical back: Neck supple.  Skin:    General: Skin is warm and dry.     Capillary Refill: Capillary refill takes less than 2 seconds.  Neurological:     Mental Status: He is alert.  Psychiatric:        Mood and Affect: Mood normal.     ED Results / Procedures / Treatments   Labs (all labs ordered are listed, but only abnormal results are displayed) Labs Reviewed  CBC WITH DIFFERENTIAL/PLATELET - Abnormal; Notable for the following components:      Result Value   WBC 10.6 (*)    Neutro Abs 8.3 (*)    All other components within normal  limits  BASIC METABOLIC PANEL - Abnormal; Notable for the following components:   Glucose, Bld 106 (*)    Calcium 8.7 (*)    All other components within normal limits  CBG MONITORING, ED - Abnormal; Notable for the following components:   Glucose-Capillary 113 (*)    All other components within normal limits  GROUP A STREP BY PCR  RESP PANEL BY RT-PCR (RSV, FLU A&B, COVID)  [id number]  TROPONIN I (HIGH SENSITIVITY)    EKG EKG Interpretation Date/Time:  Tuesday July 30 2023 12:42:30 EST Ventricular Rate:  80 PR Interval:  162 QRS Duration:  86 QT Interval:  375 QTC Calculation: 433 R Axis:   100  Text Interpretation: Sinus rhythm Probable left atrial enlargement Right axis deviation ST elev, probable normal early repol pattern No significant change  since last tracing Confirmed by Jerrol Agent ([id number]) on 07/30/2023 1:57:58 PM  Radiology DG Chest 2 View Result Date: 07/30/2023 CLINICAL DATA:  Cough and sore throat EXAM: CHEST - 2 VIEW COMPARISON:  Chest radiograph dated 12/19/2014 FINDINGS: Normal lung volumes. Rounded nodular opacity projects over the lateral left lung base favored to represent nipple shadow given symmetric nodular density at the lateral right lung base. No pleural effusion or pneumothorax. The heart size and mediastinal contours are within normal limits. No acute osseous abnormality. IMPRESSION: No active cardiopulmonary disease. Electronically Signed   By: Limin  Xu M.D.   On: 07/30/2023 13:09    Procedures Procedures    Medications Ordered in ED Medications  ketorolac  (TORADOL ) injection 30 mg (30 mg Intramuscular Given 07/30/23 1120)  lidocaine  (XYLOCAINE ) 2 % viscous mouth solution 15 mL (15 mLs Mouth/Throat Given 07/30/23 1124)    ED Course/ Medical Decision Making/ A&P Clinical Course as of 07/30/23 1426  Tue Jul 30, 2023  1314 Nursing notified by lab that the patient's labs were lost, will need to re-draw [JL]    Clinical Course User Index [JL] Jerrol Agent, MD                                 Medical Decision Making Amount and/or Complexity of Data Reviewed Labs: ordered. Radiology: ordered.  Risk Prescription drug management.    47 year old male presenting to the emergency department with multiple complaints.  The patient states that he has had a sore scratchy throat, cough, dry lips, headaches with upper respiratory congestion since this past Friday.  Last night, he also endorsed chest tightness which has since resolved.  No active chest pain he states that he has had no fevers.  Symptoms have been ongoing since this past Friday and worsened over the weekend prompting his presentation to the emergency department.  On arrival, the patient was afebrile, not tachycardic or tachypneic, BP 156/96,  saturating 97% on room air.  Patient presenting with symptoms of a URI.  Additionally complaining of chest tightness last night.  Symptoms have tightness have resolved.  Patient is tolerating oral intake and overall well-appearing.  His physical exam revealed evidence of a pharyngitis and his symptoms are consistent with likely viral upper respiratory infection.  Laboratory evaluation revealed COVID-19, influenza, RSV PCR testing negative, group A strep negative.  The patient was administered Toradol  as well as viscous lidocaine .  He was notably tolerating oral intake.  EKG, chest x-ray and screening labs were obtained in the setting of the patient's chest tightness.  EKG: Sinus rhythm, ventricular rate 8 0, early repolarization present, no  STEMI. CXR: No cardiac or pulmonary abnormality Labs: Cardiac troponin 7, CBC with a nonspecific leukocytosis to 10.6, CBG 113, BMP generally unremarkable, group A strep not detected, COVID-19, influenza, RSV PCR testing negative.  Reassessment: Following viscous lidocaine  and IM Toradol , the patient was feeling symptomatically improved, symptoms are consistent with likely viral upper respiratory infection.  His chest tightness last night had since resolved, low concern for ACS or PE, PERC negative, do not think repeat troponin testing is indicated at this time.  Advised continued symptomatic management at home, will prescribe a short course of Toradol  for pain control, advised continued fluid resuscitation.  Return precautions provided stable for discharge.   Final Clinical Impression(s) / ED Diagnoses Final diagnoses:  Upper respiratory tract infection, unspecified type  Chest tightness    Rx / DC Orders ED Discharge Orders          Ordered    ketorolac  (TORADOL ) 10 MG tablet  Every 6 hours PRN        07/30/23 1425              Jerrol Agent, MD 07/30/23 1426
